# Patient Record
Sex: Female | Born: 1965 | Race: White | Hispanic: No | Marital: Married | Smoking: Current every day smoker
Health system: Southern US, Community
[De-identification: ages and names within clinical notes are randomized; demographics above are authoritative.]

## PROBLEM LIST (undated history)

## (undated) DIAGNOSIS — F329 Major depressive disorder, single episode, unspecified: Secondary | ICD-10-CM

## (undated) DIAGNOSIS — M5136 Other intervertebral disc degeneration, lumbar region: Secondary | ICD-10-CM

## (undated) DIAGNOSIS — M5126 Other intervertebral disc displacement, lumbar region: Secondary | ICD-10-CM

## (undated) DIAGNOSIS — F32A Depression, unspecified: Secondary | ICD-10-CM

## (undated) DIAGNOSIS — T7840XA Allergy, unspecified, initial encounter: Secondary | ICD-10-CM

## (undated) DIAGNOSIS — R7303 Prediabetes: Secondary | ICD-10-CM

## (undated) DIAGNOSIS — F17200 Nicotine dependence, unspecified, uncomplicated: Secondary | ICD-10-CM

## (undated) DIAGNOSIS — M51369 Other intervertebral disc degeneration, lumbar region without mention of lumbar back pain or lower extremity pain: Secondary | ICD-10-CM

## (undated) DIAGNOSIS — I10 Essential (primary) hypertension: Secondary | ICD-10-CM

## (undated) DIAGNOSIS — E785 Hyperlipidemia, unspecified: Secondary | ICD-10-CM

## (undated) HISTORY — DX: Essential (primary) hypertension: I10

## (undated) HISTORY — DX: Hyperlipidemia, unspecified: E78.5

## (undated) HISTORY — DX: Allergy, unspecified, initial encounter: T78.40XA

## (undated) HISTORY — DX: Major depressive disorder, single episode, unspecified: F32.9

## (undated) HISTORY — PX: TUBAL LIGATION: SHX77

## (undated) HISTORY — DX: Other intervertebral disc degeneration, lumbar region: M51.36

## (undated) HISTORY — PX: LAPAROSCOPIC TUBAL LIGATION: SHX1937

## (undated) HISTORY — DX: Nicotine dependence, unspecified, uncomplicated: F17.200

## (undated) HISTORY — DX: Prediabetes: R73.03

## (undated) HISTORY — PX: GANGLION CYST EXCISION: SHX1691

## (undated) HISTORY — DX: Other intervertebral disc displacement, lumbar region: M51.26

## (undated) HISTORY — DX: Depression, unspecified: F32.A

## (undated) HISTORY — DX: Other intervertebral disc degeneration, lumbar region without mention of lumbar back pain or lower extremity pain: M51.369

---

## 2002-06-24 ENCOUNTER — Emergency Department (HOSPITAL_COMMUNITY): Admission: EM | Admit: 2002-06-24 | Discharge: 2002-06-24 | Payer: Self-pay | Admitting: Emergency Medicine

## 2002-06-24 ENCOUNTER — Encounter: Payer: Self-pay | Admitting: Emergency Medicine

## 2005-07-21 ENCOUNTER — Other Ambulatory Visit: Admission: RE | Admit: 2005-07-21 | Discharge: 2005-07-21 | Payer: Self-pay | Admitting: Emergency Medicine

## 2006-03-26 ENCOUNTER — Ambulatory Visit (HOSPITAL_COMMUNITY): Admission: RE | Admit: 2006-03-26 | Discharge: 2006-03-26 | Payer: Self-pay | Admitting: Internal Medicine

## 2008-04-27 ENCOUNTER — Ambulatory Visit (HOSPITAL_COMMUNITY): Admission: RE | Admit: 2008-04-27 | Discharge: 2008-04-27 | Payer: Self-pay | Admitting: Family Medicine

## 2009-05-16 ENCOUNTER — Emergency Department (HOSPITAL_COMMUNITY): Admission: EM | Admit: 2009-05-16 | Discharge: 2009-05-17 | Payer: Self-pay | Admitting: Emergency Medicine

## 2010-04-27 LAB — URINE MICROSCOPIC-ADD ON

## 2010-04-27 LAB — URINALYSIS, ROUTINE W REFLEX MICROSCOPIC
Bilirubin Urine: NEGATIVE
Ketones, ur: NEGATIVE mg/dL
Specific Gravity, Urine: >1.03 — ABNORMAL HIGH (ref 1.005–1.030)
pH: 6 (ref 5.0–8.0)

## 2010-04-27 LAB — URINE CULTURE: Colony Count: 8000

## 2010-04-27 LAB — PREGNANCY, URINE: Preg Test, Ur: NEGATIVE

## 2010-05-05 ENCOUNTER — Emergency Department (HOSPITAL_COMMUNITY)
Admission: EM | Admit: 2010-05-05 | Discharge: 2010-05-05 | Disposition: A | Discharge status: Home / Self Care | Payer: Medicaid Other | Attending: Emergency Medicine | Admitting: Emergency Medicine

## 2010-05-05 DIAGNOSIS — Z79899 Other long term (current) drug therapy: Secondary | ICD-10-CM | POA: Insufficient documentation

## 2010-05-05 DIAGNOSIS — E785 Hyperlipidemia, unspecified: Secondary | ICD-10-CM | POA: Insufficient documentation

## 2010-05-05 DIAGNOSIS — I10 Essential (primary) hypertension: Secondary | ICD-10-CM | POA: Insufficient documentation

## 2010-05-05 DIAGNOSIS — D219 Benign neoplasm of connective and other soft tissue, unspecified: Secondary | ICD-10-CM

## 2010-05-05 DIAGNOSIS — N898 Other specified noninflammatory disorders of vagina: Secondary | ICD-10-CM | POA: Insufficient documentation

## 2010-05-05 LAB — CBC
MCH: 31.7 pg (ref 26.0–34.0)
MCHC: 33.3 g/dL (ref 30.0–36.0)
MCV: 95.2 fL (ref 78.0–100.0)
Platelets: 196 10*3/uL (ref 150–400)

## 2010-05-05 LAB — PREGNANCY, URINE: Preg Test, Ur: NEGATIVE

## 2010-05-05 LAB — DIFFERENTIAL
Basophils Relative: 1 % (ref 0–1)
Eosinophils Absolute: 0.1 10*3/uL (ref 0.0–0.7)
Eosinophils Relative: 2 % (ref 0–5)
Lymphs Abs: 2 10*3/uL (ref 0.7–4.0)
Monocytes Absolute: 0.7 10*3/uL (ref 0.1–1.0)
Monocytes Relative: 9 % (ref 3–12)

## 2010-05-05 LAB — BASIC METABOLIC PANEL
BUN: 17 mg/dL (ref 6–23)
Calcium: 9.4 mg/dL (ref 8.4–10.5)
Chloride: 105 mEq/L (ref 96–112)
Creatinine, Ser: 0.9 mg/dL (ref 0.4–1.2)
GFR calc Af Amer: >60 mL/min (ref >60)

## 2010-05-06 ENCOUNTER — Ambulatory Visit (HOSPITAL_COMMUNITY)
Admit: 2010-05-06 | Discharge: 2010-05-06 | Disposition: A | Discharge status: Home / Self Care | Payer: Medicaid Other | Source: Ambulatory Visit | Attending: Emergency Medicine | Admitting: Emergency Medicine

## 2010-05-06 ENCOUNTER — Other Ambulatory Visit (HOSPITAL_COMMUNITY): Payer: Self-pay | Admitting: Emergency Medicine

## 2010-05-06 DIAGNOSIS — D219 Benign neoplasm of connective and other soft tissue, unspecified: Secondary | ICD-10-CM

## 2010-05-06 DIAGNOSIS — N920 Excessive and frequent menstruation with regular cycle: Secondary | ICD-10-CM | POA: Insufficient documentation

## 2010-05-06 DIAGNOSIS — D259 Leiomyoma of uterus, unspecified: Secondary | ICD-10-CM | POA: Insufficient documentation

## 2010-06-17 ENCOUNTER — Encounter (HOSPITAL_COMMUNITY): Payer: Medicaid Other

## 2010-06-17 ENCOUNTER — Other Ambulatory Visit: Payer: Self-pay | Admitting: Obstetrics & Gynecology

## 2010-06-17 LAB — SURGICAL PCR SCREEN
MRSA, PCR: NEGATIVE
Staphylococcus aureus: NEGATIVE

## 2010-06-17 LAB — CBC
HCT: 42.3 % (ref 36.0–46.0)
MCHC: 32.2 g/dL (ref 30.0–36.0)
MCV: 95.7 fL (ref 78.0–100.0)
Platelets: 172 10*3/uL (ref 150–400)
RDW: 13.4 % (ref 11.5–15.5)
WBC: 6 10*3/uL (ref 4.0–10.5)

## 2010-06-17 LAB — COMPREHENSIVE METABOLIC PANEL
Albumin: 3.9 g/dL (ref 3.5–5.2)
BUN: 13 mg/dL (ref 6–23)
Calcium: 10.4 mg/dL (ref 8.4–10.5)
Glucose, Bld: 104 mg/dL — ABNORMAL HIGH (ref 70–99)
Potassium: 4.4 mEq/L (ref 3.5–5.1)
Sodium: 139 mEq/L (ref 135–145)
Total Protein: 6.8 g/dL (ref 6.0–8.3)

## 2010-06-21 ENCOUNTER — Encounter: Payer: Self-pay | Admitting: Obstetrics & Gynecology

## 2010-06-21 LAB — HCG, QUANTITATIVE, PREGNANCY: hCG, Beta Chain, Quant, S: 1 m[IU]/mL (ref <5)

## 2010-06-21 NOTE — Progress Notes (Signed)
Subjective:     Patient ID: Cassandra Goodman, female   DOB: 1965/05/07, 46 y.o.   MRN: [id number]  Vaginal Bleeding The patient's primary symptoms include pelvic pain and vaginal bleeding. The patient's pertinent negatives include no genital itching, genital lesions, genital odor, genital rash, missed menses or vaginal discharge. This is a chronic problem. The current episode started more than 1 year ago. The problem has been gradually worsening. The pain is moderate. She is not pregnant. Associated symptoms include abdominal pain. Pertinent negatives include no anorexia, back pain, chills, constipation, diarrhea, discolored urine, dysuria, fever, flank pain, frequency, headaches, hematuria, joint pain, joint swelling, nausea, painful intercourse, rash, sore throat, urgency or vomiting.  As patient has gotten older periods much worse.  She went to ER and sonogram essentially normal, small fibroids of no clinical consequence.     Review of Systems  Constitutional: Negative.  Negative for fever and chills.  HENT: Negative.  Negative for sore throat.   Eyes: Negative.   Respiratory: Negative.   Cardiovascular: Negative.   Gastrointestinal: Positive for abdominal pain. Negative for nausea, vomiting, diarrhea, constipation and anorexia.  Genitourinary: Positive for vaginal bleeding, menstrual problem and pelvic pain. Negative for dysuria, urgency, frequency, hematuria, flank pain, decreased urine volume, vaginal discharge, enuresis, difficulty urinating, genital sores, vaginal pain, dyspareunia and missed menses.  Musculoskeletal: Negative.  Negative for back pain and joint pain.  Skin: Negative for rash.  Neurological: Negative.  Negative for headaches.  Hematological: Negative.   Psychiatric/Behavioral: Negative.        Objective:   Physical Exam  Constitutional: She is oriented to person, place, and time. She appears well-developed and well-nourished.  HENT:  Head: Normocephalic and  atraumatic.  Eyes: Pupils are equal, round, and reactive to light.  Neck: Normal range of motion. Neck supple. No thyromegaly present.  Cardiovascular: Normal rate, regular rhythm and normal heart sounds.   Pulmonary/Chest: Effort normal and breath sounds normal.  Abdominal: Soft. Bowel sounds are normal.  Genitourinary: Vagina normal and uterus normal.       Cervix normal, ovaries normal sized nontender  Musculoskeletal: Normal range of motion.  Neurological: She is alert and oriented to person, place, and time. She has normal reflexes.  Skin: Skin is warm and dry.  Psychiatric: She has a normal mood and affect. Her behavior is normal. Judgment and thought content normal.       Assessment:     Menorrhagia, chronic Metrorrhagia recently Dysmenorrhea    Plan:     Patient has responded to [facility] and wants to proceed with endometrial ablation which is scheduled for 06/22/2010.  Pamphlets given and indications/complications discussed.

## 2010-06-22 ENCOUNTER — Other Ambulatory Visit: Payer: Self-pay | Admitting: Obstetrics & Gynecology

## 2010-06-22 ENCOUNTER — Ambulatory Visit (HOSPITAL_COMMUNITY)
Admission: RE | Admit: 2010-06-22 | Discharge: 2010-06-22 | Disposition: A | Discharge status: Home / Self Care | Payer: Medicaid Other | Source: Ambulatory Visit | Attending: Obstetrics & Gynecology | Admitting: Obstetrics & Gynecology

## 2010-06-22 DIAGNOSIS — Z7982 Long term (current) use of aspirin: Secondary | ICD-10-CM | POA: Insufficient documentation

## 2010-06-22 DIAGNOSIS — Z01812 Encounter for preprocedural laboratory examination: Secondary | ICD-10-CM | POA: Insufficient documentation

## 2010-06-22 DIAGNOSIS — N946 Dysmenorrhea, unspecified: Secondary | ICD-10-CM | POA: Insufficient documentation

## 2010-06-22 DIAGNOSIS — Z79899 Other long term (current) drug therapy: Secondary | ICD-10-CM | POA: Insufficient documentation

## 2010-06-22 DIAGNOSIS — N920 Excessive and frequent menstruation with regular cycle: Secondary | ICD-10-CM | POA: Insufficient documentation

## 2010-08-10 NOTE — Op Note (Signed)
  [name], TAKEDA NO.:  [id number]  MEDICAL RECORD NO.:  [id number]  LOCATION:  [facility]                          FACILITY:  [facility]  PHYSICIAN:  Lazaro Arms, M.D.   DATE OF BIRTH:  1966-02-05  DATE OF PROCEDURE:  06/22/2010 DATE OF DISCHARGE:  06/22/2010                              OPERATIVE REPORT   PREOPERATIVE DIAGNOSES: 1. Menometrorrhagia. 2. Dysmenorrhea.  POSTOPERATIVE DIAGNOSES: 1. Menometrorrhagia. 2. Dysmenorrhea.  PROCEDURE:  Hysteroscopy, dilation and curettage, and endometrial ablation.  SURGEON:  Lazaro Arms, MD  ANESTHESIA:  Laryngeal mask airway.  FINDINGS:  The patient had normal endometrium.  No polyps, fibroids, or abnormalities.  DESCRIPTION OF OPERATION:  The patient was taken to the operating room and placed in supine position where she underwent laryngeal mask airway anesthesia, placed in dorsal supine position, prepped and draped in the usual sterile fashion.  Graves speculum was placed.  Cervix was grasped and dilated serially to allow passage of the hysteroscope.  A diagnostic hysteroscopy was performed and with video hysteroscope and there were no abnormalities seen.  A vigorous uterine curettage was performed and there was good uterine cry obtained in all areas.  ThermaChoice III endometrial ablation balloon was used and maintained a pressure between 192 mmHg throughout the procedure, which the patient tolerated well. Total heating therapy time was 8 minutes.  All the fluids returned at the end of the procedure and the equipment worked well.  The patient was awakened from anesthesia and taken recovery room in good and stable condition.  All counts correct x3.  She received Ancef and Toradol prophylactically.    Lazaro Arms, M.D.    Loraine Maple  D:  08/09/2010  T:  08/10/2010  Job:  [id number]  Electronically Signed by Duane Lope M.D. on 08/10/2010 04:45:11 PM

## 2011-01-18 ENCOUNTER — Other Ambulatory Visit (HOSPITAL_COMMUNITY): Payer: Self-pay | Admitting: Family Medicine

## 2011-01-18 DIAGNOSIS — N6320 Unspecified lump in the left breast, unspecified quadrant: Secondary | ICD-10-CM

## 2011-01-19 ENCOUNTER — Other Ambulatory Visit (HOSPITAL_BASED_OUTPATIENT_CLINIC_OR_DEPARTMENT_OTHER): Payer: Self-pay | Admitting: Internal Medicine

## 2011-02-01 ENCOUNTER — Ambulatory Visit (HOSPITAL_COMMUNITY)
Admission: RE | Admit: 2011-02-01 | Discharge: 2011-02-01 | Disposition: A | Discharge status: Home / Self Care | Payer: PRIVATE HEALTH INSURANCE | Source: Ambulatory Visit | Attending: Family Medicine | Admitting: Family Medicine

## 2011-02-01 ENCOUNTER — Other Ambulatory Visit (HOSPITAL_COMMUNITY): Payer: Self-pay | Admitting: Family Medicine

## 2011-02-01 DIAGNOSIS — N6320 Unspecified lump in the left breast, unspecified quadrant: Secondary | ICD-10-CM

## 2011-02-01 DIAGNOSIS — N63 Unspecified lump in unspecified breast: Secondary | ICD-10-CM | POA: Insufficient documentation

## 2012-05-21 ENCOUNTER — Encounter (HOSPITAL_COMMUNITY): Payer: Self-pay | Admitting: *Deleted

## 2012-05-21 ENCOUNTER — Emergency Department (HOSPITAL_COMMUNITY)
Admission: EM | Admit: 2012-05-21 | Discharge: 2012-05-21 | Disposition: A | Discharge status: Home / Self Care | Payer: Self-pay | Attending: Emergency Medicine | Admitting: Emergency Medicine

## 2012-05-21 DIAGNOSIS — Z23 Encounter for immunization: Secondary | ICD-10-CM | POA: Insufficient documentation

## 2012-05-21 DIAGNOSIS — L02419 Cutaneous abscess of limb, unspecified: Secondary | ICD-10-CM | POA: Insufficient documentation

## 2012-05-21 DIAGNOSIS — L0291 Cutaneous abscess, unspecified: Secondary | ICD-10-CM

## 2012-05-21 DIAGNOSIS — I10 Essential (primary) hypertension: Secondary | ICD-10-CM | POA: Insufficient documentation

## 2012-05-21 DIAGNOSIS — Z8639 Personal history of other endocrine, nutritional and metabolic disease: Secondary | ICD-10-CM | POA: Insufficient documentation

## 2012-05-21 DIAGNOSIS — F17200 Nicotine dependence, unspecified, uncomplicated: Secondary | ICD-10-CM | POA: Insufficient documentation

## 2012-05-21 DIAGNOSIS — Z7982 Long term (current) use of aspirin: Secondary | ICD-10-CM | POA: Insufficient documentation

## 2012-05-21 DIAGNOSIS — Z862 Personal history of diseases of the blood and blood-forming organs and certain disorders involving the immune mechanism: Secondary | ICD-10-CM | POA: Insufficient documentation

## 2012-05-21 MED ORDER — DIPHTH-ACELL PERTUSSIS-TETANUS 25-58-10 LF-MCG/0.5 IM SUSP: 0.5000 mL | Freq: Once | INTRAMUSCULAR | Status: DC

## 2012-05-21 MED ORDER — SULFAMETHOXAZOLE-TRIMETHOPRIM 800-160 MG PO TABS: 1.0000 | ORAL_TABLET | Freq: Two times a day (BID) | ORAL | Status: DC

## 2012-05-21 MED ORDER — TRAMADOL HCL 50 MG PO TABS: 50.0000 mg | ORAL_TABLET | Freq: Four times a day (QID) | ORAL | Status: DC | PRN

## 2012-05-21 MED ORDER — TETANUS-DIPHTH-ACELL PERTUSSIS 5-2.5-18.5 LF-MCG/0.5 IM SUSP
0.5000 mL | Freq: Once | INTRAMUSCULAR | Status: AC
  Administered 2012-05-21: 0.5 mL via INTRAMUSCULAR
  Filled 2012-05-21: qty 0.5

## 2012-05-21 NOTE — ED Notes (Signed)
Patient with no complaints at this time. Respirations even and unlabored. Skin warm/dry. Discharge instructions reviewed with patient at this time. Patient given opportunity to voice concerns/ask questions. Patient discharged at this time and left Emergency Department with steady gait.   

## 2012-05-21 NOTE — ED Notes (Signed)
Informed of shot time to discharge.

## 2012-05-21 NOTE — ED Notes (Signed)
Pt c/o abscess that started right leg on 04/23/2012, states that she has two new areas that started a few days ago, area is draining,

## 2012-05-21 NOTE — ED Provider Notes (Signed)
History    This chart was scribed for Cassandra Lennert, MD by Quintella Reichert, ED scribe.  This patient was seen in room [facility] and the patient's care was started at 8:56 AM.   CSN: [id number]  Arrival date & time 05/21/12  0802      Chief Complaint  Patient presents with  . Abscess     Patient is a 47 y.o. female presenting with abscess. The history is provided by the patient. No language interpreter was used.  Abscess Location:  Leg Leg abscess location:  R upper leg Size:  1 cm Abscess quality: draining and painful   Red streaking: no   Duration:  1 month Progression:  Worsening Pain details:    Quality:  Unable to specify   Severity:  Moderate   Duration:  1 month   Timing:  Constant   Progression:  Worsening Chronicity:  New Relieved by:  None tried Worsened by:  Nothing tried Ineffective treatments:  None tried Associated symptoms: no fatigue and no headaches    Cassandra Goodman is a 47 y.o. female who presents to the Emergency Department complaining of 2 abscessed on posterior right thigh that have been present for one month.  Pt states that both abscesses are painful and one is draining. Pt denies fever, chills, nausea, vomiting, diarrhea, weakness, cough, SOB and any other pain.    Past Medical History  Diagnosis Date  . Hypertension   . Hyperlipidemia     Past Surgical History  Procedure Laterality Date  . Laparoscopic tubal ligation      No family history on file.  History  Substance Use Topics  . Smoking status: Current Every Day Smoker -- 0.50 packs/day for 20 years    Types: Cigarettes  . Smokeless tobacco: Never Used     Comment: Pt had quit but restarted  . Alcohol Use: No    OB History   Grav Para Term Preterm Abortions TAB SAB Ect Mult Living   2 2 0 0 0 0 0 0 0 2       Review of Systems  Constitutional: Negative for appetite change and fatigue.  HENT: Negative for congestion, sinus pressure and ear discharge.   Eyes:  Negative for discharge.  Respiratory: Negative for cough.   Cardiovascular: Negative for chest pain.  Gastrointestinal: Negative for abdominal pain and diarrhea.  Genitourinary: Negative for frequency and hematuria.  Musculoskeletal: Negative for back pain.  Skin: Negative for rash.  Neurological: Negative for seizures and headaches.  Psychiatric/Behavioral: Negative for hallucinations.      Allergies  Morphine and related  Home Medications   Current Outpatient Rx  Name  Route  Sig  Dispense  Refill  . aspirin 81 MG tablet   Oral   Take 81 mg by mouth daily.           . multivitamin (THERAGRAN) per tablet   Oral   Take 1 tablet by mouth daily.             BP 145/102  Pulse 90  Temp(Src) 97.2 F (36.2 C) (Oral)  Resp 18  Ht 5\' 2"  (1.575 m)  Wt 209 lb 7 oz (95 kg)  BMI 38.3 kg/m2  SpO2 98%  Physical Exam  Nursing note and vitals reviewed. Constitutional: She is oriented to person, place, and time. She appears well-developed.  HENT:  Head: Normocephalic.  Eyes: Conjunctivae are normal.  Neck: No tracheal deviation present.  Cardiovascular:  No murmur heard. Musculoskeletal: Normal range  of motion.  Neurological: She is oriented to person, place, and time.  Skin: Skin is warm.  Posterior right thigh: 2 1-cm inflamed areas.  One is opened up and drained, the other is small abscess.  Psychiatric: She has a normal mood and affect.      ED Course  Procedures (including critical care time)  DIAGNOSTIC STUDIES: Oxygen Saturation is 98% on room air, normal by my interpretation.    COORDINATION OF CARE: 8:59 AM-Discussed treatment plan which includes pain medication, antibiotics, a tetanus shot, a daily cleaning routine, and f/u with [facility] Department with pt at bedside and pt agreed to plan.  9:19 AM Ordered:  Medications  diptheria-acellular pertussis-tetanus (INFANRIX) injection 0.5 mL (not administered)        Labs Reviewed - No data to  display No results found.   No diagnosis found.    MDM        The chart was scribed for me under my direct supervision.  I personally performed the history, physical, and medical decision making and all procedures in the evaluation of this patient.Cassandra Lennert, MD 05/21/12 [phone]

## 2012-05-23 ENCOUNTER — Encounter (HOSPITAL_COMMUNITY): Payer: Self-pay

## 2012-05-23 ENCOUNTER — Emergency Department (HOSPITAL_COMMUNITY)
Admission: EM | Admit: 2012-05-23 | Discharge: 2012-05-23 | Disposition: A | Discharge status: Home / Self Care | Payer: Self-pay | Attending: Emergency Medicine | Admitting: Emergency Medicine

## 2012-05-23 DIAGNOSIS — I10 Essential (primary) hypertension: Secondary | ICD-10-CM | POA: Insufficient documentation

## 2012-05-23 DIAGNOSIS — F17200 Nicotine dependence, unspecified, uncomplicated: Secondary | ICD-10-CM | POA: Insufficient documentation

## 2012-05-23 DIAGNOSIS — L02419 Cutaneous abscess of limb, unspecified: Secondary | ICD-10-CM | POA: Insufficient documentation

## 2012-05-23 DIAGNOSIS — Z7982 Long term (current) use of aspirin: Secondary | ICD-10-CM | POA: Insufficient documentation

## 2012-05-23 DIAGNOSIS — Z862 Personal history of diseases of the blood and blood-forming organs and certain disorders involving the immune mechanism: Secondary | ICD-10-CM | POA: Insufficient documentation

## 2012-05-23 DIAGNOSIS — L0291 Cutaneous abscess, unspecified: Secondary | ICD-10-CM

## 2012-05-23 DIAGNOSIS — Z79899 Other long term (current) drug therapy: Secondary | ICD-10-CM | POA: Insufficient documentation

## 2012-05-23 DIAGNOSIS — Z8639 Personal history of other endocrine, nutritional and metabolic disease: Secondary | ICD-10-CM | POA: Insufficient documentation

## 2012-05-23 MED ORDER — LIDOCAINE HCL (PF) 2 % IJ SOLN
INTRAMUSCULAR | Status: AC
  Filled 2012-05-23: qty 10

## 2012-05-23 MED ORDER — HYDROCODONE-ACETAMINOPHEN 5-325 MG PO TABS: 1.0000 | ORAL_TABLET | ORAL | Status: DC | PRN

## 2012-05-23 NOTE — ED Notes (Signed)
Here for recheck of two areas to back of right leg that may be ? Staff infection

## 2012-05-23 NOTE — ED Notes (Signed)
Pt here for recheck of abscesses to r posterior thigh.   Area is red and one abscess is open and draining.  Pt says is presently on antibiotics.  Pt says she feels like the abscesses are worse.

## 2012-05-25 LAB — CULTURE, ROUTINE-ABSCESS

## 2012-05-25 NOTE — ED Provider Notes (Signed)
History     CSN: [id number]  Arrival date & time 05/23/12  0746   First MD Initiated Contact with Patient 05/23/12 0809      Chief Complaint  Patient presents with  . Wound Check    (Consider location/radiation/quality/duration/timing/severity/associated sxs/prior treatment) HPI Comments: Cassandra Goodman is a 47 y.o. Female presenting for a recheck of 2 abscess on her right posterior thigh which have been present for approximately the last week now.  She was seen here 2 days ago and placed on bactrim and she has been using warm compress soaks.  The upper abscess which was drainage has now sealed over and has stopped draining,  Reporting this one is starting to feel better.  The lower abscess is larger and now more painful and has not drained.  She denies fevers or chills, nausea or vomiting.  There has been no radiation of pain, but is worse with palpation. Pain is improved with the tramadol she was prescribed, but briefly.     The history is provided by the patient.    Past Medical History  Diagnosis Date  . Hypertension   . Hyperlipidemia     Past Surgical History  Procedure Laterality Date  . Laparoscopic tubal ligation      No family history on file.  History  Substance Use Topics  . Smoking status: Current Every Day Smoker -- 0.50 packs/day for 20 years    Types: Cigarettes  . Smokeless tobacco: Never Used     Comment: Pt had quit but restarted  . Alcohol Use: No    OB History   Grav Para Term Preterm Abortions TAB SAB Ect Mult Living   2 2 0 0 0 0 0 0 0 2       Review of Systems  Constitutional: Negative for fever and chills.  HENT: Negative for facial swelling.   Respiratory: Negative for shortness of breath and wheezing.   Skin: Positive for wound.  Neurological: Negative for numbness.    Allergies  Morphine and related  Home Medications   Current Outpatient Rx  Name  Route  Sig  Dispense  Refill  . aspirin 81 MG tablet   Oral   Take 81 mg by  mouth daily.           . multivitamin (THERAGRAN) per tablet   Oral   Take 1 tablet by mouth daily.           Marland Kitchen sulfamethoxazole-trimethoprim (BACTRIM DS,SEPTRA DS) 800-160 MG per tablet   Oral   Take 1 tablet by mouth 2 (two) times daily. One po bid x 3 days   20 tablet   0   . traMADol (ULTRAM) 50 MG tablet   Oral   Take 1 tablet (50 mg total) by mouth every 6 (six) hours as needed for pain.   15 tablet   0   . HYDROcodone-acetaminophen (NORCO/VICODIN) 5-325 MG per tablet   Oral   Take 1 tablet by mouth every 4 (four) hours as needed for pain.   15 tablet   0     BP 141/82  Pulse 91  Temp(Src) 97.7 F (36.5 C) (Oral)  Resp 22  Ht 5\' 3"  (1.6 m)  Wt 209 lb (94.802 kg)  BMI 37.03 kg/m2  SpO2 98%  Physical Exam  Constitutional: She appears well-developed and well-nourished. No distress.  HENT:  Head: Normocephalic.  Neck: Neck supple.  Cardiovascular: Normal rate.   Pulmonary/Chest: Effort normal. She has no wheezes.  Musculoskeletal:  Normal range of motion. She exhibits no edema.  Skin:  2 abscess noted right posterior thigh,  The more proximal one is flat with no fluctuance or induration with granulation tissue present giving appearance of 1 cm slightly ulcerated lesion, but healing.  No surrounding erythema.  The lower abscess is also 1 cm,  Raised,  Indurated and exquisitely ttp, 2 cm surrounding erythema,  No drainage.  No fluctuance.    ED Course  Procedures (including critical care time)  INCISION AND DRAINAGE Performed by: Burgess Amor Consent: Verbal consent obtained. Risks and benefits: risks, benefits and alternatives were discussed Type: abscess  Body area: right posterior thigh  Anesthesia: local infiltration  Incision was made with a scalpel.  Local anesthetic: lidocaine 2% without epinephrine  Anesthetic total: 2 ml  Complexity: complex Blunt dissection to break up loculations  Drainage: purulent  Drainage amount:  small  Packing material: no packing, abscess cavity is small  Patient tolerance: Patient tolerated the procedure well with no immediate complications.     Labs Reviewed  CULTURE, ROUTINE-ABSCESS   No results found.   1. Abscess    Wound culture obtained.   MDM  Encouraged continued warm soaks,  Completion of bactrim.  Given script for hydrocodone for use in place of tramadol.  Recheck here or by health dept if sx do not resolve or worsen in any way.       Burgess Amor, PA-C 05/25/12 [phone]

## 2012-05-26 ENCOUNTER — Telehealth (HOSPITAL_COMMUNITY): Payer: Self-pay | Admitting: Emergency Medicine

## 2012-05-26 NOTE — ED Notes (Signed)
Patient has +Abscess culture. °

## 2012-05-26 NOTE — ED Notes (Signed)
+  Abscess. Patient treated with Bactrim on 4/15 visit. Sensitive to same. Per protocol MD.

## 2012-05-27 NOTE — ED Provider Notes (Signed)
Medical screening examination/treatment/procedure(s) were performed by non-physician practitioner and as supervising physician I was immediately available for consultation/collaboration.   Elleni Mozingo W. Latifa Noble, MD 05/27/12 1012 

## 2012-06-14 ENCOUNTER — Encounter (HOSPITAL_COMMUNITY): Payer: Self-pay

## 2012-06-14 ENCOUNTER — Emergency Department (HOSPITAL_COMMUNITY)
Admission: EM | Admit: 2012-06-14 | Discharge: 2012-06-14 | Disposition: A | Discharge status: Home / Self Care | Payer: Self-pay | Attending: Emergency Medicine | Admitting: Emergency Medicine

## 2012-06-14 DIAGNOSIS — R062 Wheezing: Secondary | ICD-10-CM | POA: Insufficient documentation

## 2012-06-14 DIAGNOSIS — Z7982 Long term (current) use of aspirin: Secondary | ICD-10-CM | POA: Insufficient documentation

## 2012-06-14 DIAGNOSIS — Z8639 Personal history of other endocrine, nutritional and metabolic disease: Secondary | ICD-10-CM | POA: Insufficient documentation

## 2012-06-14 DIAGNOSIS — R0602 Shortness of breath: Secondary | ICD-10-CM | POA: Insufficient documentation

## 2012-06-14 DIAGNOSIS — F1010 Alcohol abuse, uncomplicated: Secondary | ICD-10-CM | POA: Insufficient documentation

## 2012-06-14 DIAGNOSIS — Z79899 Other long term (current) drug therapy: Secondary | ICD-10-CM | POA: Insufficient documentation

## 2012-06-14 DIAGNOSIS — F17200 Nicotine dependence, unspecified, uncomplicated: Secondary | ICD-10-CM | POA: Insufficient documentation

## 2012-06-14 DIAGNOSIS — I10 Essential (primary) hypertension: Secondary | ICD-10-CM | POA: Insufficient documentation

## 2012-06-14 DIAGNOSIS — Z862 Personal history of diseases of the blood and blood-forming organs and certain disorders involving the immune mechanism: Secondary | ICD-10-CM | POA: Insufficient documentation

## 2012-06-14 LAB — RAPID URINE DRUG SCREEN, HOSP PERFORMED
Opiates: NOT DETECTED
Tetrahydrocannabinol: NOT DETECTED

## 2012-06-14 LAB — POCT I-STAT, CHEM 8
Chloride: 110 mEq/L (ref 96–112)
Creatinine, Ser: 0.6 mg/dL (ref 0.50–1.10)
Glucose, Bld: 139 mg/dL — ABNORMAL HIGH (ref 70–99)
HCT: 48 % — ABNORMAL HIGH (ref 36.0–46.0)
Potassium: 2.7 mEq/L — CL (ref 3.5–5.1)

## 2012-06-14 LAB — ETHANOL: Alcohol, Ethyl (B): 121 mg/dL — ABNORMAL HIGH (ref 0–11)

## 2012-06-14 MED ORDER — POTASSIUM CHLORIDE CRYS ER 20 MEQ PO TBCR
60.0000 meq | EXTENDED_RELEASE_TABLET | Freq: Once | ORAL | Status: AC
  Administered 2012-06-14: 60 meq via ORAL
  Filled 2012-06-14: qty 3

## 2012-06-14 MED ORDER — ALBUTEROL SULFATE (5 MG/ML) 0.5% IN NEBU
2.5000 mg | INHALATION_SOLUTION | Freq: Once | RESPIRATORY_TRACT | Status: AC
  Administered 2012-06-14: 2.5 mg via RESPIRATORY_TRACT
  Filled 2012-06-14: qty 0.5

## 2012-06-14 MED ORDER — IPRATROPIUM BROMIDE 0.02 % IN SOLN
0.5000 mg | Freq: Once | RESPIRATORY_TRACT | Status: AC
  Administered 2012-06-14: 0.5 mg via RESPIRATORY_TRACT
  Filled 2012-06-14: qty 2.5

## 2012-06-14 MED ORDER — OXYMETAZOLINE HCL 0.05 % NA SOLN
1.0000 | Freq: Once | NASAL | Status: AC
  Administered 2012-06-14: 1 via NASAL
  Filled 2012-06-14: qty 15

## 2012-06-14 MED ORDER — ALPRAZOLAM 0.5 MG PO TABS
0.5000 mg | ORAL_TABLET | Freq: Once | ORAL | Status: AC
  Administered 2012-06-14: 0.5 mg via ORAL
  Filled 2012-06-14: qty 1

## 2012-06-14 NOTE — ED Notes (Signed)
Patient having a panic attack; states that she had a couple of shots, quit her job today per EMS.

## 2012-06-14 NOTE — ED Provider Notes (Signed)
History     CSN: [id number]  Arrival date & time 06/14/12  0030   First MD Initiated Contact with Patient 06/14/12 0044      Chief Complaint  Patient presents with  . Panic Attack    (Consider location/radiation/quality/duration/timing/severity/associated sxs/prior treatment) HPI HPI Comments: HPI Comments: Cassandra Goodman is a 47 y.o. female brought in by ambulance, who presents to the Emergency Department complaining of shortness of breath and having been drinking. She reports she had several shots of liquor, quit her job today and now she feel shortness of breath. She hears some wheezing. She denies nausea, vomiting, fever, chills. She states there is chest pain with deep breathing.   Past Medical History  Diagnosis Date  . Hypertension   . Hyperlipidemia     Past Surgical History  Procedure Laterality Date  . Laparoscopic tubal ligation      No family history on file.  History  Substance Use Topics  . Smoking status: Current Every Day Smoker -- 0.50 packs/day for 20 years    Types: Cigarettes  . Smokeless tobacco: Never Used     Comment: Pt had quit but restarted  . Alcohol Use: No    OB History   Grav Para Term Preterm Abortions TAB SAB Ect Mult Living   2 2 0 0 0 0 0 0 0 2       Review of Systems  Constitutional: Negative for fever.       10 Systems reviewed and are negative for acute change except as noted in the HPI.  HENT: Negative for congestion.   Eyes: Negative for discharge and redness.  Respiratory: Positive for shortness of breath. Negative for cough.   Cardiovascular: Negative for chest pain.  Gastrointestinal: Negative for vomiting and abdominal pain.  Musculoskeletal: Negative for back pain.  Skin: Negative for rash.  Neurological: Negative for syncope, numbness and headaches.  Psychiatric/Behavioral:       No behavior change.    Allergies  Morphine and related  Home Medications   Current Outpatient Rx  Name  Route  Sig  Dispense   Refill  . aspirin 81 MG tablet   Oral   Take 81 mg by mouth daily.           Marland Kitchen HYDROcodone-acetaminophen (NORCO/VICODIN) 5-325 MG per tablet   Oral   Take 1 tablet by mouth every 4 (four) hours as needed for pain.   15 tablet   0   . multivitamin (THERAGRAN) per tablet   Oral   Take 1 tablet by mouth daily.           Marland Kitchen sulfamethoxazole-trimethoprim (BACTRIM DS,SEPTRA DS) 800-160 MG per tablet   Oral   Take 1 tablet by mouth 2 (two) times daily. One po bid x 3 days   20 tablet   0   . traMADol (ULTRAM) 50 MG tablet   Oral   Take 1 tablet (50 mg total) by mouth every 6 (six) hours as needed for pain.   15 tablet   0     BP 147/88  Pulse 94  Temp(Src) 97.7 F (36.5 C) (Oral)  Resp 20  Ht 5\' 4"  (1.626 m)  Wt 205 lb (92.987 kg)  BMI 35.17 kg/m2  SpO2 96%  Physical Exam  Nursing note and vitals reviewed. Constitutional: She appears well-developed and well-nourished.  Awake, alert, nontoxic appearance.  HENT:  Head: Normocephalic and atraumatic.  Eyes: EOM are normal. Pupils are equal, round, and reactive to light.  Neck: Neck supple.  Cardiovascular: Normal rate and intact distal pulses.   Pulmonary/Chest: Effort normal and breath sounds normal. She exhibits no tenderness.  Abdominal: Soft. Bowel sounds are normal. There is no tenderness. There is no rebound.  Musculoskeletal: She exhibits no tenderness.  Baseline ROM, no obvious new focal weakness.  Neurological:  Mental status and motor strength appears baseline for patient and situation.  Skin: No rash noted.  Psychiatric: She has a normal mood and affect.    ED Course  Procedures (including critical care time) Results for orders placed during the hospital encounter of 06/14/12  ETHANOL      Result Value Range   Alcohol, Ethyl (B) 121 (*) 0 - 11 mg/dL  URINE RAPID DRUG SCREEN (HOSP PERFORMED)      Result Value Range   Opiates NONE DETECTED  NONE DETECTED   Cocaine NONE DETECTED  NONE DETECTED    Benzodiazepines NONE DETECTED  NONE DETECTED   Amphetamines NONE DETECTED  NONE DETECTED   Tetrahydrocannabinol NONE DETECTED  NONE DETECTED   Barbiturates NONE DETECTED  NONE DETECTED  POCT I-STAT, CHEM 8      Result Value Range   Sodium 144  135 - 145 mEq/L   Potassium 2.7 (*) 3.5 - 5.1 mEq/L   Chloride 110  96 - 112 mEq/L   BUN 4 (*) 6 - 23 mg/dL   Creatinine, Ser 1.61  0.50 - 1.10 mg/dL   Glucose, Bld 096 (*) 70 - 99 mg/dL   Calcium, Ion 0.45  4.09 - 1.23 mmol/L   TCO2 21  0 - 100 mmol/L   Hemoglobin 16.3 (*) 12.0 - 15.0 g/dL   HCT 81.1 (*) 91.4 - 78.2 %   Comment NOTIFIED PHYSICIAN       Medications  albuterol (PROVENTIL) (5 MG/ML) 0.5% nebulizer solution 2.5 mg (2.5 mg Nebulization Given 06/14/12 0059)  ipratropium (ATROVENT) nebulizer solution 0.5 mg (0.5 mg Nebulization Given 06/14/12 0100)  ALPRAZolam (XANAX) tablet 0.5 mg (0.5 mg Oral Given 06/14/12 0109)  potassium chloride SA (K-DUR,KLOR-CON) CR tablet 60 mEq (60 mEq Oral Given 06/14/12 0127)  oxymetazoline (AFRIN) 0.05 % nasal spray 1 spray (1 spray Each Nare Given 06/14/12 0232)    0234 Patient was given albuterol/atrovent with relief of wheezing. Still felt short of breath with a pulse ox of 97% on RA. Given afrin which cleared the nasal passages. Ambulated in the department with pulse ox 97-98% on RA.  MDM  Patient here with shortness of breath. Given albuterol/atrovent with relief of wheezing but with continued shortness of breath despite pulse ox of 97% on RA. Given Afrin which cleared nasal passages. Potassium was low, 2.7. She was given potassium. Alcohol was 121. UDS negative. Reviewed results with the patient. Pt stable in ED with no significant deterioration in condition.The patient appears reasonably screened and/or stabilized for discharge and I doubt any other medical condition or other [facility] requiring further screening, evaluation, or treatment in the ED at this time prior to discharge.  MDM Reviewed: nursing note and  vitals Interpretation: labs           Nicoletta Dress. Colon Branch, MD 06/14/12 [phone]

## 2012-06-17 LAB — POCT I-STAT TROPONIN I

## 2012-09-10 ENCOUNTER — Encounter (HOSPITAL_COMMUNITY): Payer: Self-pay

## 2012-09-10 ENCOUNTER — Emergency Department (HOSPITAL_COMMUNITY): Payer: Self-pay

## 2012-09-10 ENCOUNTER — Emergency Department (HOSPITAL_COMMUNITY)
Admission: EM | Admit: 2012-09-10 | Discharge: 2012-09-10 | Disposition: A | Discharge status: Home / Self Care | Payer: Self-pay | Attending: Emergency Medicine | Admitting: Emergency Medicine

## 2012-09-10 DIAGNOSIS — Z862 Personal history of diseases of the blood and blood-forming organs and certain disorders involving the immune mechanism: Secondary | ICD-10-CM | POA: Insufficient documentation

## 2012-09-10 DIAGNOSIS — J320 Chronic maxillary sinusitis: Secondary | ICD-10-CM | POA: Insufficient documentation

## 2012-09-10 DIAGNOSIS — R51 Headache: Secondary | ICD-10-CM | POA: Insufficient documentation

## 2012-09-10 DIAGNOSIS — R209 Unspecified disturbances of skin sensation: Secondary | ICD-10-CM | POA: Insufficient documentation

## 2012-09-10 DIAGNOSIS — R202 Paresthesia of skin: Secondary | ICD-10-CM

## 2012-09-10 DIAGNOSIS — I10 Essential (primary) hypertension: Secondary | ICD-10-CM | POA: Insufficient documentation

## 2012-09-10 DIAGNOSIS — Z8639 Personal history of other endocrine, nutritional and metabolic disease: Secondary | ICD-10-CM | POA: Insufficient documentation

## 2012-09-10 DIAGNOSIS — F17200 Nicotine dependence, unspecified, uncomplicated: Secondary | ICD-10-CM | POA: Insufficient documentation

## 2012-09-10 LAB — CBC WITH DIFFERENTIAL/PLATELET
Basophils Absolute: 0 10*3/uL (ref 0.0–0.1)
Eosinophils Absolute: 0.2 10*3/uL (ref 0.0–0.7)
Eosinophils Relative: 2 % (ref 0–5)
Lymphocytes Relative: 33 % (ref 12–46)
MCV: 95.5 fL (ref 78.0–100.0)
Neutrophils Relative %: 57 % (ref 43–77)
Platelets: 189 10*3/uL (ref 150–400)
RDW: 12.9 % (ref 11.5–15.5)
WBC: 6.2 10*3/uL (ref 4.0–10.5)

## 2012-09-10 LAB — ETHANOL: Alcohol, Ethyl (B): <11 mg/dL (ref 0–11)

## 2012-09-10 LAB — RAPID URINE DRUG SCREEN, HOSP PERFORMED
Cocaine: NOT DETECTED
Opiates: NOT DETECTED
Tetrahydrocannabinol: NOT DETECTED

## 2012-09-10 LAB — COMPREHENSIVE METABOLIC PANEL
AST: 38 U/L — ABNORMAL HIGH (ref 0–37)
BUN: 8 mg/dL (ref 6–23)
CO2: 23 mEq/L (ref 19–32)
Calcium: 9.7 mg/dL (ref 8.4–10.5)
Creatinine, Ser: 0.64 mg/dL (ref 0.50–1.10)
GFR calc Af Amer: >90 mL/min (ref >90)
GFR calc non Af Amer: >90 mL/min (ref >90)

## 2012-09-10 LAB — TROPONIN I: Troponin I: <0.3 ng/mL (ref <0.30)

## 2012-09-10 LAB — POCT I-STAT TROPONIN I: Troponin i, poc: 0 ng/mL (ref 0.00–0.08)

## 2012-09-10 LAB — URINALYSIS, ROUTINE W REFLEX MICROSCOPIC
Bilirubin Urine: NEGATIVE
Glucose, UA: NEGATIVE mg/dL
Specific Gravity, Urine: 1.01 (ref 1.005–1.030)
pH: 7 (ref 5.0–8.0)

## 2012-09-10 LAB — APTT: aPTT: 28 seconds (ref 24–37)

## 2012-09-10 LAB — GLUCOSE, CAPILLARY: Glucose-Capillary: 97 mg/dL (ref 70–99)

## 2012-09-10 MED ORDER — FLUTICASONE PROPIONATE 50 MCG/ACT NA SUSP: 2.0000 | Freq: Every day | NASAL | Status: DC

## 2012-09-10 MED ORDER — ONDANSETRON HCL 4 MG/2ML IJ SOLN: 4.0000 mg | Freq: Once | INTRAMUSCULAR | Status: AC

## 2012-09-10 MED ORDER — ASPIRIN 325 MG PO TABS
325.0000 mg | ORAL_TABLET | Freq: Once | ORAL | Status: AC
  Administered 2012-09-10: 325 mg via ORAL
  Filled 2012-09-10: qty 1

## 2012-09-10 MED ORDER — ONDANSETRON HCL 4 MG/2ML IJ SOLN
INTRAMUSCULAR | Status: AC
  Administered 2012-09-10: 4 mg via INTRAVENOUS
  Filled 2012-09-10: qty 2

## 2012-09-10 NOTE — ED Notes (Signed)
Pt reports that she woke from sleep at 12:30 today, having numbness all over her left side.  Feel hot all over her "insides", no difficulty w/ speech or movement.  Headache since waking.

## 2012-09-10 NOTE — ED Provider Notes (Signed)
CSN: [id number]     Arrival date & time 09/10/12  1455 History     First MD Initiated Contact with Patient 09/10/12 1514     Chief Complaint  Patient presents with  . Numbness   (Consider location/radiation/quality/duration/timing/severity/associated sxs/prior Treatment) HPI Comments: Cassandra Goodman is a 47 y.o. Female awoke for her nighttime sleep at 12:30 today and noticed that her left neck left arm and left leg were numb. The discomfort persisted. She was able to eat a peanut butter sandwich, and ambulate to a vehicle. She presents for evaluation by private vehicle. She has had a headache since awakening. She denies fever, chills, nausea, vomiting, weakness, or dizziness. She has had intermittent numbness in her left hand, that she related to repetitive activities at work for 2 weeks. She's not had this checked. She has untreated hypertension. She has not seen a doctor in 2 years. She states she also has hyperlipidemia. She denies chest pain, shortness of breath, back pain or abdominal pain. There are no other known modifying factors.  The history is provided by the patient.    Past Medical History  Diagnosis Date  . Hypertension   . Hyperlipidemia    Past Surgical History  Procedure Laterality Date  . Laparoscopic tubal ligation     No family history on file. History  Substance Use Topics  . Smoking status: Current Every Day Smoker -- 0.50 packs/day for 20 years    Types: Cigarettes  . Smokeless tobacco: Never Used     Comment: Pt had quit but restarted  . Alcohol Use: Yes     Comment: occ   OB History   Grav Para Term Preterm Abortions TAB SAB Ect Mult Living   2 2 0 0 0 0 0 0 0 2      Review of Systems  All other systems reviewed and are negative.    Allergies  Morphine and related  Home Medications   Current Outpatient Rx  Name  Route  Sig  Dispense  Refill  . fluticasone (FLONASE) 50 MCG/ACT nasal spray   Nasal   Place 2 sprays into the nose daily.   16  g   0    BP 137/87  Pulse 64  Temp(Src) 97.8 F (36.6 C) (Oral)  Resp 20  SpO2 100% Physical Exam  Nursing note and vitals reviewed. Constitutional: She is oriented to person, place, and time. She appears well-developed.  Appears older than stated age.   HENT:  Head: Normocephalic and atraumatic.  Eyes: Conjunctivae and EOM are normal. Pupils are equal, round, and reactive to light.  Neck: Normal range of motion and phonation normal. Neck supple.  Cardiovascular: Normal rate, regular rhythm and intact distal pulses.   Pulmonary/Chest: Effort normal and breath sounds normal. She exhibits no tenderness.  Abdominal: Soft. She exhibits no distension. There is no tenderness. There is no guarding.  Musculoskeletal: Normal range of motion.  Neurological: She is alert and oriented to person, place, and time. She has normal strength. She exhibits normal muscle tone.  Very mild decreased light touch sensation to the left lateral neck left upper and lower arms, but not the left hand. Mild decreased light touch sensation left thigh, anterior and posterior. No altered light touch sensation to the left lower leg. Normal finger to nose, and normal heel-to-shin, bilaterally.  Skin: Skin is warm and dry.  Psychiatric: Her behavior is normal. Judgment and thought content normal.   Flat affect, appears depressed    ED Course  Procedures (including critical care time)  Medications  ondansetron (ZOFRAN) injection 4 mg (4 mg Intravenous Given 09/10/12 1928)  aspirin tablet 325 mg (325 mg Oral Given 09/10/12 1952)    Patient Vitals for the past 24 hrs:  BP Temp Temp src Pulse Resp SpO2  09/10/12 1930 137/87 mmHg - - 64 20 -  09/10/12 1915 140/84 mmHg - - 64 12 -  09/10/12 1900 140/87 mmHg - - 65 16 -  09/10/12 1845 139/89 mmHg - - 59 12 -  09/10/12 1844 - - - 60 17 -  09/10/12 1842 139/95 mmHg - - - - -  09/10/12 1700 153/91 mmHg - - 58 15 -  09/10/12 1621 150/80 mmHg - - - 20 100 %  09/10/12  1615 150/80 mmHg - - 62 15 -  09/10/12 1600 138/81 mmHg - - 65 15 -  09/10/12 1545 141/78 mmHg - - 69 16 -  09/10/12 1530 140/74 mmHg - - 73 21 -  09/10/12 1520 - 97.8 F (36.6 C) - - - -  09/10/12 1515 135/83 mmHg - - 72 16 -  09/10/12 1500 132/99 mmHg 97.8 F (36.6 C) Oral 74 20 100 %   5:50 PM Reevaluation with update and discussion. After initial assessment and treatment, an updated evaluation reveals physical examination is unchanged with the exception, of, now, she has additionally some decreased light. She sensation to all 5 fingertips of the left hand. She denies headache, weakness, nausea, or vomiting. Vital signs were repeated and are reassuring. Cassandra Goodman    5:42 PM-Consult complete with Dr. Marjory Sneddon. Patient case explained and discussed. He feels pt needs an MR to evaluate for MS vs. CVA.  Call ended at 17:55  7:41 PM-Consult complete with Dr. Clista Bernhardt. Patient case explained and discussed. He agrees to see patient in office for further evaluation and treatment. Call ended at 1945  7:47 PM Reevaluation with update and discussion. After initial assessment and treatment, an updated evaluation reveals she remains comfortable. She relates sinus congestion for sevearl weeks. Cassandra Goodman    Date: 09/10/12  Rate: 75  Rhythm: normal sinus rhythm  QRS Axis: normal  PR and QT Intervals: normal  ST/T Wave abnormalities: normal  PR and QRS Conduction Disutrbances:none  Narrative Interpretation:   Old EKG Reviewed: unchanged- 06/17/10   Labs Reviewed  COMPREHENSIVE METABOLIC PANEL - Abnormal; Notable for the following:    Glucose, Bld 117 (*)    AST 38 (*)    ALT 40 (*)    All other components within normal limits  URINALYSIS, ROUTINE W REFLEX MICROSCOPIC - Abnormal; Notable for the following:    Hgb urine dipstick SMALL (*)    All other components within normal limits  CBC WITH DIFFERENTIAL - Abnormal; Notable for the following:    Hemoglobin 15.3 (*)    All other  components within normal limits  ETHANOL  PROTIME-INR  APTT  TROPONIN I  URINE RAPID DRUG SCREEN (HOSP PERFORMED)  GLUCOSE, CAPILLARY  URINE MICROSCOPIC-ADD ON  POCT I-STAT TROPONIN I   Ct Head Wo Contrast  09/10/2012   *RADIOLOGY REPORT*  Clinical Data: Left-sided hand numbness.  Tingling in left-sided neck and arm.  CT HEAD WITHOUT CONTRAST  Technique:  Contiguous axial images were obtained from the base of the skull through the vertex without contrast.  Comparison: None.  Findings: Bone windows demonstrate near complete opacification of left maxillary sinus.  Soft tissue windows demonstrate probable dilated perivascular space in the left basal ganglia image  13/series 2. No  mass lesion, hemorrhage, hydrocephalus, acute infarct, intra-axial, or extra- axial fluid collection.  IMPRESSION:  1. No acute intracranial abnormality. 2.  Sinus disease.   Original Report Authenticated By: Jeronimo Greaves, M.D.   Mr Brain Wo Contrast  09/10/2012   *RADIOLOGY REPORT*  Clinical Data: 47 year old female with headache weakness and confusion.  MRI HEAD WITHOUT CONTRAST  Technique:  Multiplanar, multiecho pulse sequences of the brain and surrounding structures were obtained according to standard protocol without intravenous contrast.  Comparison: Head CT without contrast 09/10/2012.  Findings: Study is occasionally degraded by motion artifact despite repeated imaging attempts.  No restricted diffusion to suggest acute infarction.  No midline shift, mass effect, evidence of mass lesion, ventriculomegaly, extra-axial collection or acute intracranial hemorrhage. Cervicomedullary junction and pituitary are within normal limits. Negative visualized superior cervical spine. Major intracranial vascular flow voids are preserved.  Wallace Cullens and white matter signal is within normal limits for age throughout the brain.  Normal bone marrow signal. Visualized orbit soft tissues are within normal limits.  Mastoids are clear.  Grossly normal  visualized internal auditory structures.  Left maxillary sinus fluid level. Mild right maxillary sinus mucosal thickening or mucous retention cyst.  Other paranasal sinuses are clear.  Negative scalp soft tissues.  IMPRESSION: 1. Normal noncontrast MRI appearance of the brain. 2.  Left maxillary sinus inflammatory changes.   Original Report Authenticated By: Erskine Speed, M.D.   1. Paresthesia   2. Sinusitis, maxillary, chronic     MDM  Nonspecific paresthesia, and relatively young patient. She is at risk for multiple sclerosis. No lesions were seen on the MR. Both CT and MR dated indicates sinusitis. Sinusitis is incidental. Doubt metabolic instability, serious bacterial infection or impending vascular collapse; the patient is stable for discharge.  Nursing Notes Reviewed/ Care Coordinated, and agree without changes. Applicable Imaging Reviewed.  Interpretation of Laboratory Data incorporated into ED treatment   Plan: Home Medications- Flonase; Home Treatments and Observation- rest, watch for progressive symptom; return here if the recommended treatment, does not improve the symptoms; Recommended follow up- neurology followup 2-3 days        Flint Melter, MD 09/11/12 [phone]

## 2012-09-10 NOTE — ED Notes (Signed)
Dr.wentz notified. Of pt's arrival

## 2012-09-10 NOTE — ED Notes (Signed)
Patient to MRI.

## 2012-09-10 NOTE — ED Notes (Signed)
Pt over for ct

## 2012-09-11 NOTE — Progress Notes (Signed)
   CARE MANAGEMENT ED NOTE 09/11/2012  Patient:  KADISHA, GOODINE   Account Number:  [id number]  Date Initiated:  09/10/2012  Documentation initiated by:  Anibal Henderson  Subjective/Objective Assessment:   ED CM noted pt did not have insurance nor PCP listed     Subjective/Objective Assessment Detail:   Sppke with pt and she does not have ins, nor PCP. Gave handouts for [facility], food [employer]. Patient very appreciative     Action/Plan:   Action/Plan Detail:   Anticipated DC Date:  09/10/2012     Status Recommendation to Physician:   Result of Recommendation:        Choice offered to / List presented to:            Status of service:  Completed, signed off  ED Comments:   ED Comments Detail:     CARE MANAGEMENT ED NOTE 09/11/2012  Patient:  MARCINA, KINNISON   Account Number:  [id number]  Date Initiated:  09/10/2012  Documentation initiated by:  Anibal Henderson  Subjective/Objective Assessment:   ED CM noted pt did not have insurance nor PCP listed     Subjective/Objective Assessment Detail:   Sppke with pt and she does not have ins, nor PCP. Gave handouts for [facility], food [employer]. Patient very appreciative     Action/Plan:   Action/Plan Detail:   Anticipated DC Date:  09/10/2012     Status Recommendation to Physician:   Result of Recommendation:        Choice offered to / List presented to:            Status of service:  Completed, signed off  ED Comments:   ED Comments Detail:

## 2012-10-09 ENCOUNTER — Other Ambulatory Visit (HOSPITAL_COMMUNITY): Payer: Self-pay | Admitting: Physician Assistant

## 2012-10-09 DIAGNOSIS — Z139 Encounter for screening, unspecified: Secondary | ICD-10-CM

## 2012-10-14 ENCOUNTER — Ambulatory Visit (HOSPITAL_COMMUNITY): Payer: Self-pay

## 2012-10-21 ENCOUNTER — Ambulatory Visit (HOSPITAL_COMMUNITY)
Admission: RE | Admit: 2012-10-21 | Discharge: 2012-10-21 | Disposition: A | Discharge status: Home / Self Care | Payer: Self-pay | Source: Ambulatory Visit | Attending: Physician Assistant | Admitting: Physician Assistant

## 2012-10-21 DIAGNOSIS — Z139 Encounter for screening, unspecified: Secondary | ICD-10-CM

## 2013-11-04 ENCOUNTER — Encounter (HOSPITAL_COMMUNITY): Payer: Self-pay | Admitting: Emergency Medicine

## 2013-11-04 ENCOUNTER — Emergency Department (HOSPITAL_COMMUNITY)
Admission: EM | Admit: 2013-11-04 | Discharge: 2013-11-04 | Disposition: A | Discharge status: Home / Self Care | Payer: Self-pay | Attending: Emergency Medicine | Admitting: Emergency Medicine

## 2013-11-04 DIAGNOSIS — Z8639 Personal history of other endocrine, nutritional and metabolic disease: Secondary | ICD-10-CM | POA: Insufficient documentation

## 2013-11-04 DIAGNOSIS — L02519 Cutaneous abscess of unspecified hand: Secondary | ICD-10-CM | POA: Insufficient documentation

## 2013-11-04 DIAGNOSIS — W268XXA Contact with other sharp object(s), not elsewhere classified, initial encounter: Secondary | ICD-10-CM | POA: Insufficient documentation

## 2013-11-04 DIAGNOSIS — L089 Local infection of the skin and subcutaneous tissue, unspecified: Secondary | ICD-10-CM | POA: Insufficient documentation

## 2013-11-04 DIAGNOSIS — Z862 Personal history of diseases of the blood and blood-forming organs and certain disorders involving the immune mechanism: Secondary | ICD-10-CM | POA: Insufficient documentation

## 2013-11-04 DIAGNOSIS — Y9289 Other specified places as the place of occurrence of the external cause: Secondary | ICD-10-CM | POA: Insufficient documentation

## 2013-11-04 DIAGNOSIS — L03119 Cellulitis of unspecified part of limb: Secondary | ICD-10-CM

## 2013-11-04 DIAGNOSIS — Y9389 Activity, other specified: Secondary | ICD-10-CM | POA: Insufficient documentation

## 2013-11-04 DIAGNOSIS — L02511 Cutaneous abscess of right hand: Secondary | ICD-10-CM

## 2013-11-04 DIAGNOSIS — I10 Essential (primary) hypertension: Secondary | ICD-10-CM | POA: Insufficient documentation

## 2013-11-04 DIAGNOSIS — IMO0002 Reserved for concepts with insufficient information to code with codable children: Secondary | ICD-10-CM | POA: Insufficient documentation

## 2013-11-04 DIAGNOSIS — S61409A Unspecified open wound of unspecified hand, initial encounter: Secondary | ICD-10-CM | POA: Insufficient documentation

## 2013-11-04 DIAGNOSIS — F17200 Nicotine dependence, unspecified, uncomplicated: Secondary | ICD-10-CM | POA: Insufficient documentation

## 2013-11-04 MED ORDER — DOXYCYCLINE HYCLATE 100 MG PO TABS
100.0000 mg | ORAL_TABLET | Freq: Once | ORAL | Status: AC
  Administered 2013-11-04: 100 mg via ORAL
  Filled 2013-11-04: qty 1

## 2013-11-04 MED ORDER — DOXYCYCLINE HYCLATE 100 MG IV SOLR
100.0000 mg | Freq: Once | INTRAVENOUS | Status: AC
Start: 2013-11-04 — End: 2013-11-04
  Administered 2013-11-04: 100 mg via INTRAVENOUS
  Filled 2013-11-04: qty 100

## 2013-11-04 MED ORDER — LIDOCAINE HCL (PF) 1 % IJ SOLN
5.0000 mL | Freq: Once | INTRAMUSCULAR | Status: AC
  Administered 2013-11-04: 5 mL
  Filled 2013-11-04: qty 5

## 2013-11-04 MED ORDER — DOXYCYCLINE HYCLATE 100 MG PO CAPS: 100.0000 mg | ORAL_CAPSULE | Freq: Two times a day (BID) | ORAL | Status: DC

## 2013-11-04 MED ORDER — FENTANYL CITRATE 0.05 MG/ML IJ SOLN
100.0000 ug | Freq: Once | INTRAMUSCULAR | Status: AC
  Administered 2013-11-04: 100 ug via INTRAVENOUS
  Filled 2013-11-04: qty 2

## 2013-11-04 MED ORDER — HYDROMORPHONE HCL 1 MG/ML IJ SOLN
1.0000 mg | Freq: Once | INTRAMUSCULAR | Status: AC
  Administered 2013-11-04: 1 mg via INTRAVENOUS
  Filled 2013-11-04: qty 1

## 2013-11-04 MED ORDER — OXYCODONE-ACETAMINOPHEN 5-325 MG PO TABS
2.0000 | ORAL_TABLET | Freq: Once | ORAL | Status: AC
  Administered 2013-11-04: 2 via ORAL
  Filled 2013-11-04: qty 2

## 2013-11-04 NOTE — Discharge Instructions (Signed)

## 2013-11-04 NOTE — ED Notes (Signed)
PT cut her right hand on a cardboard box Thursday and it has progressively worsened with redness/swelling/pain since then.

## 2013-11-04 NOTE — ED Provider Notes (Signed)
CSN: [id number]     Arrival date & time 11/04/13  0715 History  This chart was scribed for Cassandra Cable, MD by Delphia Grates, ED Scribe. This patient was seen in room APA06/APA06 and the patient's care was started at 7:43 AM.    Chief Complaint  Patient presents with  . Wound Infection    The history is provided by the patient. No language interpreter was used.   HPI Comments: Cassandra Goodman is a 48 y.o. female who presents to the Emergency Department complaining of a gradually worsening wound infection to right hand onset 5 days ago. Patient accidentally cut her right hand on a cardboard box, and states the wound has gotten progressively worse since. There is associated redness, swelling, and pain to the area. She denies any foreign bodies, drainage, or loss of sensation. Patient is unsure of when she received her last tetanus immunization.  Past Medical History  Diagnosis Date  . Hypertension   . Hyperlipidemia    Past Surgical History  Procedure Laterality Date  . Laparoscopic tubal ligation     No family history on file. History  Substance Use Topics  . Smoking status: Current Every Day Smoker -- 0.50 packs/day for 20 years    Types: Cigarettes  . Smokeless tobacco: Never Used     Comment: Pt had quit but restarted  . Alcohol Use: Yes     Comment: occ   OB History   Grav Para Term Preterm Abortions TAB SAB Ect Mult Living   2 2 0 0 0 0 0 0 0 2      Review of Systems  Constitutional: Negative for fever.  Gastrointestinal: Negative for vomiting.  Skin: Positive for wound.  Neurological: Negative for weakness and numbness.  All other systems reviewed and are negative.     Allergies  Morphine and related  Home Medications   Prior to Admission medications   Medication Sig Start Date End Date Taking? Authorizing Provider  fluticasone (FLONASE) 50 MCG/ACT nasal spray Place 2 sprays into the nose daily. 09/10/12   Richarda Blade, MD   Triage Vitals: BP  133/80  Pulse 88  Temp(Src) 98.3 F (36.8 C) (Oral)  Resp 18  Ht 5\' 4"  (1.626 m)  Wt 193 lb (87.544 kg)  BMI 33.11 kg/m2  SpO2 99%  Physical Exam CONSTITUTIONAL: Well developed/well nourished HEAD: Normocephalic/atraumatic ENMT: Mucous membranes moist NECK: supple no meningeal signs CV: S1/S2 noted, no murmurs/rubs/gallops noted LUNGS: Lungs are clear to auscultation bilaterally, no apparent distress ABDOMEN: soft, nontender, no rebound or guarding GU:no cva tenderness NEURO: Pt is awake/alert, moves all extremitiesx4 EXTREMITIES: pulses normal, full ROM. Patient has full flexion of thumb and fingers on right hand. No crepitus, no streaking noted to arm SKIN: warm, color normal,abscess noted to thenar eminence with overlying erythema.  No crepitus noted PSYCH: no abnormalities of mood noted  ED Course  Procedures   DIAGNOSTIC STUDIES: Oxygen Saturation is 99% on room air, normal by my interpretation.    COORDINATION OF CARE: At [phone] Discussed treatment plan with patient which includes wound cleaning and I&D. Patient agrees.   INCISION AND DRAINAGE PROCEDURE NOTE: Patient identification was confirmed and verbal consent was obtained. This procedure was performed by Cassandra Cable, MD at 8:48 AM. Site: right hand Sterile procedures observed Needle size: 23 Anesthetic used (type and amt): lidocaine 1% 40ml Blade size: 11 Drainage: moderate Complexity: Complex Packing none Site anesthetized, incision made over site, wound drained and explored loculations, rinsed  with copious amounts of normal saline,covered with sterile dressing.  Pt tolerated procedure well without complications.  Instructions for care discussed verbally and pt provided with additional written instructions for homecare and f/u.   Unable to gain adequate pain control with multiple doses of IV narcotics as well as local anesthesia Pt did have significant drainage of her abscess but will require further  drainage There is no crepitus She has full flexion of right wrist as well as all fingers on right hand No foreign bodies noted.  No signs of trauma. She is not diabetic Unable to embed photo into chart due to technical issues   9:43 AM D/w dr Lenon Curt Will see in office in next 24 hours Discussed strict return precautions with patient  MDM   Final diagnoses:  Abscess of right hand    Nursing notes including past medical history and social history reviewed and considered in documentation   I personally performed the services described in this documentation, which was scribed in my presence. The recorded information has been reviewed and is accurate.        Cassandra Cable, MD 11/04/13 [phone]

## 2013-12-08 ENCOUNTER — Encounter (HOSPITAL_COMMUNITY): Payer: Self-pay | Admitting: Emergency Medicine

## 2014-05-07 ENCOUNTER — Encounter: Payer: Self-pay | Admitting: *Deleted

## 2014-05-18 ENCOUNTER — Ambulatory Visit: Payer: 59 | Admitting: Family Medicine

## 2014-05-21 ENCOUNTER — Encounter: Payer: Self-pay | Admitting: Family Medicine

## 2014-05-21 ENCOUNTER — Ambulatory Visit (INDEPENDENT_AMBULATORY_CARE_PROVIDER_SITE_OTHER): Payer: 59 | Admitting: Family Medicine

## 2014-05-21 VITALS — BP 140/88 | HR 86 | Temp 98.9°F | Resp 18 | Ht 64.0 in | Wt 205.0 lb

## 2014-05-21 DIAGNOSIS — E785 Hyperlipidemia, unspecified: Secondary | ICD-10-CM

## 2014-05-21 DIAGNOSIS — Z720 Tobacco use: Secondary | ICD-10-CM

## 2014-05-21 DIAGNOSIS — Z716 Tobacco abuse counseling: Secondary | ICD-10-CM

## 2014-05-21 DIAGNOSIS — Z7189 Other specified counseling: Secondary | ICD-10-CM | POA: Diagnosis not present

## 2014-05-21 DIAGNOSIS — F329 Major depressive disorder, single episode, unspecified: Secondary | ICD-10-CM | POA: Diagnosis not present

## 2014-05-21 DIAGNOSIS — F32A Depression, unspecified: Secondary | ICD-10-CM

## 2014-05-21 DIAGNOSIS — I10 Essential (primary) hypertension: Secondary | ICD-10-CM | POA: Diagnosis not present

## 2014-05-21 DIAGNOSIS — F17200 Nicotine dependence, unspecified, uncomplicated: Secondary | ICD-10-CM | POA: Insufficient documentation

## 2014-05-21 DIAGNOSIS — Z7689 Persons encountering health services in other specified circumstances: Secondary | ICD-10-CM

## 2014-05-21 MED ORDER — SIMVASTATIN 20 MG PO TABS: 20.0000 mg | ORAL_TABLET | Freq: Every day | ORAL | Status: DC

## 2014-05-21 MED ORDER — VARENICLINE TARTRATE 0.5 MG X 11 & 1 MG X 42 PO MISC: ORAL | Status: DC

## 2014-05-21 MED ORDER — CITALOPRAM HYDROBROMIDE 20 MG PO TABS: 20.0000 mg | ORAL_TABLET | Freq: Every day | ORAL | Status: DC

## 2014-05-21 MED ORDER — LISINOPRIL-HYDROCHLOROTHIAZIDE 20-25 MG PO TABS: 1.0000 | ORAL_TABLET | Freq: Every day | ORAL | Status: DC

## 2014-05-21 NOTE — Progress Notes (Signed)
Subjective:    Patient ID: Cassandra Goodman, female    DOB: March 13, 1965, 49 y.o.   MRN: [id number]  HPI She is here today to establish care.  She is a very pleasant 49 year old white female. Past medical history includes hypertension, hyperlipidemia, depression, and tobacco abuse currently she states that her depression is well controlled on the citalopram 20 mg a day. Unfortunately her husband was recently diagnosed with bladder cancer as well as cancer in the spine. She is aware of the poor prognosis of this diagnosis but she accepts reality of this and is dealing with it quite well. She is very interested in smoking cessation. We had a long discussion today regarding options for smoking cessation and she is very interested in Chantix. I explained the risk of increased suicidal thoughts and depression but she feels like her mood is well controlled at the present point and she is motivated to quit due to the recent cancer diagnosis and her family. Her blood pressure today is borderline at 140/88. She denies any chest pain shortness of breath or dyspnea on exertion. She is overdue for fasting lab work. She is also due for mammogram as well as a Pap smear. She would like to defer the mammogram at the present time because she is busy taking her husband to cancer treatments. She would also like to defer the Pap smear the present time Past Medical History  Diagnosis Date  . Hypertension   . Hyperlipidemia   . Allergy     SEASONAL  . Depression   . Smoker    Past Surgical History  Procedure Laterality Date  . Laparoscopic tubal ligation    . Tubal ligation     Current Outpatient Prescriptions on File Prior to Visit  Medication Sig Dispense Refill  . aspirin 81 MG tablet Take 81 mg by mouth daily.    . Multiple Vitamin (MULTIVITAMIN WITH MINERALS) TABS tablet Take 1 tablet by mouth daily.     No current facility-administered medications on file prior to visit.   Allergies  Allergen Reactions  .  Morphine And Related Shortness Of Breath and Itching    Sweating   History   Social History  . Marital Status: Married    Spouse Name: Nithya Meriweather  . Number of Children: 2  . Years of Education: 12   Occupational History  . unemployed    Social History Main Topics  . Smoking status: Current Every Day Smoker -- 1.00 packs/day for 20 years    Types: Cigarettes  . Smokeless tobacco: Never Used     Comment: Pt had quit but restarted  . Alcohol Use: Yes     Comment: OCCASIONALLY  . Drug Use: No  . Sexual Activity: No   Other Topics Concern  . Not on file   Social History Narrative      Review of Systems  All other systems reviewed and are negative.      Objective:   Physical Exam  Constitutional: She appears well-developed and well-nourished.  Neck: Neck supple. No JVD present. No thyromegaly present.  Cardiovascular: Normal rate, regular rhythm and normal heart sounds.   No murmur heard. Pulmonary/Chest: Effort normal and breath sounds normal. No respiratory distress. She has no wheezes. She has no rales.  Abdominal: Soft. Bowel sounds are normal. She exhibits no distension. There is no tenderness. There is no rebound and no guarding.  Musculoskeletal: She exhibits no edema.  Lymphadenopathy:    She has no cervical adenopathy.  Vitals reviewed.         Assessment & Plan:  Encounter for smoking cessation counseling - Plan: varenicline (CHANTIX STARTING MONTH PAK) 0.5 MG X 11 & 1 MG X 42 tablet  Establishing care with new doctor, encounter for  Benign essential HTN  HLD (hyperlipidemia)  Depression  I would like the patient to return fasting for a CBC, CMP, fasting lipid panel. We had a long discussion regarding whether or not this was the right time to quit smoking given the stress her family is going through. However she is very motivated and would like to try as Chantix starter pack. Therefore I gave her a prescription for this. Her blood pressures  acceptable today. I would like her to return fasting for lab work. Ideally I would like her LDL cholesterol below 100 given her smoking and hypertension and hyperlipidemia. I will continue citalopram. We consulted perform a mammogram and a Pap smear when her schedule permits. I recommended that both of these be performed this year.

## 2014-05-22 ENCOUNTER — Other Ambulatory Visit: Payer: 59

## 2014-05-22 DIAGNOSIS — E785 Hyperlipidemia, unspecified: Secondary | ICD-10-CM

## 2014-05-22 LAB — CBC WITH DIFFERENTIAL/PLATELET
Basophils Absolute: 0.1 10*3/uL (ref 0.0–0.1)
Basophils Relative: 1 % (ref 0–1)
Eosinophils Absolute: 0.1 10*3/uL (ref 0.0–0.7)
Eosinophils Relative: 2 % (ref 0–5)
HEMATOCRIT: 43 % (ref 36.0–46.0)
Hemoglobin: 14.9 g/dL (ref 12.0–15.0)
LYMPHS ABS: 1.7 10*3/uL (ref 0.7–4.0)
LYMPHS PCT: 28 % (ref 12–46)
MCH: 32.4 pg (ref 26.0–34.0)
MCHC: 34.7 g/dL (ref 30.0–36.0)
MCV: 93.5 fL (ref 78.0–100.0)
MONO ABS: 0.7 10*3/uL (ref 0.1–1.0)
MPV: 11 fL (ref 8.6–12.4)
Monocytes Relative: 11 % (ref 3–12)
Neutro Abs: 3.6 10*3/uL (ref 1.7–7.7)
Neutrophils Relative %: 58 % (ref 43–77)
Platelets: 226 10*3/uL (ref 150–400)
RBC: 4.6 MIL/uL (ref 3.87–5.11)
RDW: 14.3 % (ref 11.5–15.5)
WBC: 6.2 10*3/uL (ref 4.0–10.5)

## 2014-05-22 LAB — COMPLETE METABOLIC PANEL WITH GFR
ALBUMIN: 4 g/dL (ref 3.5–5.2)
ALK PHOS: 62 U/L (ref 39–117)
ALT: 36 U/L — AB (ref 0–35)
AST: 32 U/L (ref 0–37)
BUN: 9 mg/dL (ref 6–23)
CO2: 26 mEq/L (ref 19–32)
Calcium: 9.6 mg/dL (ref 8.4–10.5)
Chloride: 103 mEq/L (ref 96–112)
Creat: 0.63 mg/dL (ref 0.50–1.10)
GFR, Est African American: >89 mL/min
GFR, Est Non African American: >89 mL/min
GLUCOSE: 93 mg/dL (ref 70–99)
POTASSIUM: 4.3 meq/L (ref 3.5–5.3)
Sodium: 137 mEq/L (ref 135–145)
Total Bilirubin: 0.5 mg/dL (ref 0.2–1.2)
Total Protein: 6.6 g/dL (ref 6.0–8.3)

## 2014-05-22 LAB — LIPID PANEL
Cholesterol: 219 mg/dL — ABNORMAL HIGH (ref 0–200)
HDL: 53 mg/dL (ref >=46)
LDL CALC: 135 mg/dL — AB (ref 0–99)
Total CHOL/HDL Ratio: 4.1 Ratio
Triglycerides: 157 mg/dL — ABNORMAL HIGH (ref <150)
VLDL: 31 mg/dL (ref 0–40)

## 2014-05-27 ENCOUNTER — Other Ambulatory Visit: Payer: Self-pay | Admitting: Family Medicine

## 2014-05-27 DIAGNOSIS — Z79899 Other long term (current) drug therapy: Secondary | ICD-10-CM

## 2014-05-27 DIAGNOSIS — E785 Hyperlipidemia, unspecified: Secondary | ICD-10-CM

## 2014-05-27 MED ORDER — ATORVASTATIN CALCIUM 40 MG PO TABS: 40.0000 mg | ORAL_TABLET | Freq: Every day | ORAL | Status: DC

## 2014-06-02 ENCOUNTER — Telehealth: Payer: Self-pay | Admitting: *Deleted

## 2014-06-02 NOTE — Telephone Encounter (Signed)
Received request from pharmacy for PA on Chantix.  PA submitted.   Dx: Z72.0- Personal history of Smoking

## 2014-06-02 NOTE — Telephone Encounter (Signed)
Received PA determination.   PA approved 06/02/2015- 06/02/2015.  PA- [id number].  Pharmacy made aware.

## 2014-06-30 ENCOUNTER — Other Ambulatory Visit: Payer: Self-pay | Admitting: Family Medicine

## 2014-06-30 DIAGNOSIS — Z716 Tobacco abuse counseling: Secondary | ICD-10-CM

## 2014-06-30 NOTE — Telephone Encounter (Signed)
Pharmacy requesting refill Chantix starter pack??  Called patient, has been using starter pack.  Now needs continuation RX.  OK?

## 2014-06-30 NOTE — Telephone Encounter (Signed)
Refill a CONTINUING MONTH pack, (rfx2)

## 2014-07-01 MED ORDER — VARENICLINE TARTRATE 1 MG PO TABS: 1.0000 mg | ORAL_TABLET | Freq: Two times a day (BID) | ORAL | Status: DC

## 2014-07-01 NOTE — Telephone Encounter (Signed)
rx to pharmacy 

## 2014-09-21 ENCOUNTER — Other Ambulatory Visit: Payer: Self-pay | Admitting: Orthopaedic Surgery

## 2014-09-21 DIAGNOSIS — M79671 Pain in right foot: Secondary | ICD-10-CM

## 2014-09-21 DIAGNOSIS — S9031XS Contusion of right foot, sequela: Secondary | ICD-10-CM

## 2014-09-23 ENCOUNTER — Ambulatory Visit
Admission: RE | Admit: 2014-09-23 | Discharge: 2014-09-23 | Disposition: A | Discharge status: Home / Self Care | Payer: Worker's Compensation | Source: Ambulatory Visit | Attending: Orthopaedic Surgery | Admitting: Orthopaedic Surgery

## 2014-09-23 DIAGNOSIS — M79671 Pain in right foot: Secondary | ICD-10-CM

## 2014-09-23 DIAGNOSIS — S9031XS Contusion of right foot, sequela: Secondary | ICD-10-CM

## 2014-10-28 LAB — HM MAMMOGRAPHY

## 2014-11-11 ENCOUNTER — Other Ambulatory Visit: Payer: Self-pay | Admitting: Family Medicine

## 2014-11-12 ENCOUNTER — Ambulatory Visit (INDEPENDENT_AMBULATORY_CARE_PROVIDER_SITE_OTHER): Payer: 59 | Admitting: Family Medicine

## 2014-11-12 ENCOUNTER — Encounter: Payer: Self-pay | Admitting: Family Medicine

## 2014-11-12 VITALS — BP 130/74 | HR 68 | Temp 98.1°F | Resp 16 | Wt 221.0 lb

## 2014-11-12 DIAGNOSIS — Z23 Encounter for immunization: Secondary | ICD-10-CM

## 2014-11-12 DIAGNOSIS — K219 Gastro-esophageal reflux disease without esophagitis: Secondary | ICD-10-CM

## 2014-11-12 LAB — CBC WITH DIFFERENTIAL/PLATELET
BASOS ABS: 0 10*3/uL (ref 0.0–0.1)
Basophils Relative: 0 % (ref 0–1)
EOS PCT: 2 % (ref 0–5)
Eosinophils Absolute: 0.1 10*3/uL (ref 0.0–0.7)
HEMATOCRIT: 42.2 % (ref 36.0–46.0)
Hemoglobin: 14.1 g/dL (ref 12.0–15.0)
LYMPHS ABS: 2.2 10*3/uL (ref 0.7–4.0)
LYMPHS PCT: 34 % (ref 12–46)
MCH: 31 pg (ref 26.0–34.0)
MCHC: 33.4 g/dL (ref 30.0–36.0)
MCV: 92.7 fL (ref 78.0–100.0)
MONO ABS: 0.5 10*3/uL (ref 0.1–1.0)
MONOS PCT: 8 % (ref 3–12)
MPV: 10.8 fL (ref 8.6–12.4)
Neutro Abs: 3.6 10*3/uL (ref 1.7–7.7)
Neutrophils Relative %: 56 % (ref 43–77)
Platelets: 203 10*3/uL (ref 150–400)
RBC: 4.55 MIL/uL (ref 3.87–5.11)
RDW: 13.4 % (ref 11.5–15.5)
WBC: 6.5 10*3/uL (ref 4.0–10.5)

## 2014-11-12 LAB — COMPLETE METABOLIC PANEL WITH GFR
ALT: 22 U/L (ref 6–29)
AST: 20 U/L (ref 10–35)
Albumin: 4.1 g/dL (ref 3.6–5.1)
Alkaline Phosphatase: 73 U/L (ref 33–115)
BILIRUBIN TOTAL: 0.5 mg/dL (ref 0.2–1.2)
BUN: 11 mg/dL (ref 7–25)
CALCIUM: 9.7 mg/dL (ref 8.6–10.2)
CHLORIDE: 102 mmol/L (ref 98–110)
CO2: 29 mmol/L (ref 20–31)
CREATININE: 0.62 mg/dL (ref 0.50–1.10)
GFR, Est Non African American: >89 mL/min (ref >=60)
Glucose, Bld: 85 mg/dL (ref 70–99)
Potassium: 3.9 mmol/L (ref 3.5–5.3)
Sodium: 140 mmol/L (ref 135–146)
Total Protein: 6.8 g/dL (ref 6.1–8.1)

## 2014-11-12 NOTE — Progress Notes (Signed)
Subjective:    Patient ID: Cassandra Goodman, female    DOB: 04-06-1965, 49 y.o.   MRN: [id number]  HPI 05/2014 She is here today to establish care.  She is a very pleasant 49 year old white female. Past medical history includes hypertension, hyperlipidemia, depression, and tobacco abuse currently she states that her depression is well controlled on the citalopram 20 mg a day. Unfortunately her husband was recently diagnosed with bladder cancer as well as cancer in the spine. She is aware of the poor prognosis of this diagnosis but she accepts reality of this and is dealing with it quite well. She is very interested in smoking cessation. We had a long discussion today regarding options for smoking cessation and she is very interested in Chantix. I explained the risk of increased suicidal thoughts and depression but she feels like her mood is well controlled at the present point and she is motivated to quit due to the recent cancer diagnosis and her family. Her blood pressure today is borderline at 140/88. She denies any chest pain shortness of breath or dyspnea on exertion. She is overdue for fasting lab work. She is also due for mammogram as well as a Pap smear. She would like to defer the mammogram at the present time because she is busy taking her husband to cancer treatments. She would also like to defer the Pap smear the present time.  At that time, my plan ws: I would like the patient to return fasting for a CBC, CMP, fasting lipid panel. We had a long discussion regarding whether or not this was the right time to quit smoking given the stress her family is going through. However she is very motivated and would like to try as Chantix starter pack. Therefore I gave her a prescription for this. Her blood pressures acceptable today. I would like her to return fasting for lab work. Ideally I would like her LDL cholesterol below 100 given her smoking and hypertension and hyperlipidemia. I will continue  citalopram. We consulted perform a mammogram and a Pap smear when her schedule permits. I recommended that both of these be performed this year.  11/12/14 Since when I last saw the patient she has quit smoking. Unfortunate she gained substantial weight with smoking cessation. Since gaining the weight, she reports daily acid reflux every time she eats. She reports indigestion and central chest discomfort. She denies any angina, shortness of breath, or dyspnea on exertion. She denies any black tarry stools or bright red blood per rectum. Past Medical History  Diagnosis Date  . Hypertension   . Hyperlipidemia   . Allergy     SEASONAL  . Depression   . Smoker    Past Surgical History  Procedure Laterality Date  . Laparoscopic tubal ligation    . Tubal ligation     Current Outpatient Prescriptions on File Prior to Visit  Medication Sig Dispense Refill  . aspirin 81 MG tablet Take 81 mg by mouth daily.    Marland Kitchen atorvastatin (LIPITOR) 40 MG tablet TAKE ONE TABLET BY MOUTH ONCE DAILY 30 tablet 0  . citalopram (CELEXA) 20 MG tablet TAKE ONE TABLET BY MOUTH ONCE DAILY 30 tablet 0  . lisinopril-hydrochlorothiazide (PRINZIDE,ZESTORETIC) 20-25 MG tablet TAKE ONE TABLET BY MOUTH ONCE DAILY 30 tablet 0  . Multiple Vitamin (MULTIVITAMIN WITH MINERALS) TABS tablet Take 1 tablet by mouth daily.     No current facility-administered medications on file prior to visit.   Allergies  Allergen Reactions  .  Morphine And Related Shortness Of Breath and Itching    Sweating   Social History   Social History  . Marital Status: Married    Spouse Name: Marializ Ferrebee  . Number of Children: 2  . Years of Education: 12   Occupational History  . unemployed    Social History Main Topics  . Smoking status: Former Smoker -- 1.00 packs/day for 20 years    Types: Cigarettes  . Smokeless tobacco: Former Systems developer    Quit date: 06/11/2014  . Alcohol Use: 0.0 oz/week    0 Standard drinks or equivalent per week      Comment: OCCASIONALLY  . Drug Use: No  . Sexual Activity: No   Other Topics Concern  . Not on file   Social History Narrative      Review of Systems  All other systems reviewed and are negative.      Objective:   Physical Exam  Constitutional: She appears well-developed and well-nourished.  Neck: Neck supple. No JVD present. No thyromegaly present.  Cardiovascular: Normal rate, regular rhythm and normal heart sounds.   No murmur heard. Pulmonary/Chest: Effort normal and breath sounds normal. No respiratory distress. She has no wheezes. She has no rales.  Abdominal: Soft. Bowel sounds are normal. She exhibits no distension. There is no tenderness. There is no rebound and no guarding.  Musculoskeletal: She exhibits no edema.  Lymphadenopathy:    She has no cervical adenopathy.  Vitals reviewed.         Assessment & Plan:  Gastroesophageal reflux disease without esophagitis - Plan: H. pylori breath test, CBC with Differential/Platelet, COMPLETE METABOLIC PANEL WITH GFR  Begin NExium 40 mg poqday and recheck in one week. I will also check an H. pylori breath test.

## 2014-11-12 NOTE — Addendum Note (Signed)
Addended by: Shary Decamp B on: 11/12/2014 02:50 PM   Modules accepted: Orders

## 2014-11-13 ENCOUNTER — Encounter: Payer: Self-pay | Admitting: Family Medicine

## 2014-11-13 LAB — H. PYLORI BREATH TEST: H. pylori Breath Test: DETECTED — AB

## 2014-11-16 ENCOUNTER — Other Ambulatory Visit: Payer: Self-pay

## 2014-11-16 ENCOUNTER — Other Ambulatory Visit: Payer: Self-pay | Admitting: Family Medicine

## 2014-11-16 MED ORDER — AMOXICILL-CLARITHRO-LANSOPRAZ PO MISC: Freq: Two times a day (BID) | ORAL | Status: DC

## 2014-11-17 ENCOUNTER — Telehealth: Payer: Self-pay | Admitting: Family Medicine

## 2014-11-17 ENCOUNTER — Encounter: Payer: Self-pay | Admitting: *Deleted

## 2014-11-17 NOTE — Telephone Encounter (Signed)
Abby wilson calling from patients employer calling to speak with you regarding Ari's fmla papers.  Please call her at [phone]

## 2014-11-18 ENCOUNTER — Telehealth: Payer: Self-pay | Admitting: *Deleted

## 2014-11-18 NOTE — Telephone Encounter (Signed)
Received request from pharmacy for PA on PrevPac.  PA submitted.   Dx: B51.81- H Pylori Gastritis.

## 2014-11-18 NOTE — Telephone Encounter (Signed)
lmtrc

## 2014-11-23 MED ORDER — AMOXICILL-CLARITHRO-LANSOPRAZ PO MISC
ORAL | Status: DC
Start: 2014-11-23 — End: 2014-12-28

## 2014-11-23 NOTE — Telephone Encounter (Signed)
Sent over RX for Split up version of the PrevPac as we have not heard anything back from insurance yet.

## 2014-11-23 NOTE — Telephone Encounter (Signed)
lmtrc

## 2014-11-27 NOTE — Telephone Encounter (Signed)
I have left numerous messages to Abby with no return call, called again today will no return call

## 2014-12-02 ENCOUNTER — Telehealth: Payer: Self-pay | Admitting: Family Medicine

## 2014-12-02 NOTE — Telephone Encounter (Signed)
Patient wants to know what kind of infection she had in her stomach  [phone]

## 2014-12-02 NOTE — Telephone Encounter (Signed)
Called and explained about H.Pylori

## 2014-12-13 ENCOUNTER — Other Ambulatory Visit: Payer: Self-pay | Admitting: Family Medicine

## 2014-12-14 NOTE — Telephone Encounter (Signed)
Refill appropriate and filled per protocol. 

## 2014-12-28 ENCOUNTER — Encounter: Payer: Self-pay | Admitting: Family Medicine

## 2014-12-28 ENCOUNTER — Ambulatory Visit (INDEPENDENT_AMBULATORY_CARE_PROVIDER_SITE_OTHER): Payer: 59 | Admitting: Family Medicine

## 2014-12-28 VITALS — BP 128/70 | HR 78 | Temp 97.9°F | Resp 20 | Ht 64.0 in | Wt 236.0 lb

## 2014-12-28 DIAGNOSIS — K219 Gastro-esophageal reflux disease without esophagitis: Secondary | ICD-10-CM

## 2014-12-28 DIAGNOSIS — G4700 Insomnia, unspecified: Secondary | ICD-10-CM | POA: Diagnosis not present

## 2014-12-28 MED ORDER — ALPRAZOLAM 1 MG PO TABS: 1.0000 mg | ORAL_TABLET | Freq: Every evening | ORAL | Status: DC | PRN

## 2014-12-28 NOTE — Progress Notes (Signed)
Subjective:    Patient ID: Cassandra Goodman, female    DOB: 04-29-1965, 49 y.o.   MRN: [id number]  HPI 05/2014 She is here today to establish care.  She is a very pleasant 49 year old white female. Past medical history includes hypertension, hyperlipidemia, depression, and tobacco abuse currently she states that her depression is well controlled on the citalopram 20 mg a day. Unfortunately her husband was recently diagnosed with bladder cancer as well as cancer in the spine. She is aware of the poor prognosis of this diagnosis but she accepts reality of this and is dealing with it quite well. She is very interested in smoking cessation. We had a long discussion today regarding options for smoking cessation and she is very interested in Chantix. I explained the risk of increased suicidal thoughts and depression but she feels like her mood is well controlled at the present point and she is motivated to quit due to the recent cancer diagnosis and her family. Her blood pressure today is borderline at 140/88. She denies any chest pain shortness of breath or dyspnea on exertion. She is overdue for fasting lab work. She is also due for mammogram as well as a Pap smear. She would like to defer the mammogram at the present time because she is busy taking her husband to cancer treatments. She would also like to defer the Pap smear the present time.  At that time, my plan ws: I would like the patient to return fasting for a CBC, CMP, fasting lipid panel. We had a long discussion regarding whether or not this was the right time to quit smoking given the stress her family is going through. However she is very motivated and would like to try as Chantix starter pack. Therefore I gave her a prescription for this. Her blood pressures acceptable today. I would like her to return fasting for lab work. Ideally I would like her LDL cholesterol below 100 given her smoking and hypertension and hyperlipidemia. I will continue  citalopram. We consulted perform a mammogram and a Pap smear when her schedule permits. I recommended that both of these be performed this year.  11/12/14 Since when I last saw the patient she has quit smoking. Unfortunate she gained substantial weight with smoking cessation. Since gaining the weight, she reports daily acid reflux every time she eats. She reports indigestion and central chest discomfort. She denies any angina, shortness of breath, or dyspnea on exertion. She denies any black tarry stools or bright red blood per rectum.  AT that time, my plan was: Begin NExium 40 mg poqday and recheck in one week. I will also check an H. pylori breath test.  12/28/14 H pylori breath test was positive. Patient was treated with Prevpac for 2 weeks. She continue Nexium 40 mg a day thereafter. Symptoms have not improved at all. She continues to have daily reflux with burning and aching in her throat along with indigestion. She denies any weight loss. She denies any black tarry stools. She does complain of insomnia which she attributes to anxiety. She is unable to sleep at night as she lies in bed worrying about her husband's health care and her medical problems. He also watches TV in bed at night and keeps her awake. He keeps the bedroom warm so she has a difficult time sleeping. Furthermore she is not getting any exercise. Past Medical History  Diagnosis Date  . Hypertension   . Hyperlipidemia   . Allergy  SEASONAL  . Depression   . Smoker    Past Surgical History  Procedure Laterality Date  . Laparoscopic tubal ligation    . Tubal ligation     Current Outpatient Prescriptions on File Prior to Visit  Medication Sig Dispense Refill  . aspirin 81 MG tablet Take 81 mg by mouth daily.    Marland Kitchen atorvastatin (LIPITOR) 40 MG tablet TAKE ONE TABLET BY MOUTH ONCE DAILY 30 tablet 0  . citalopram (CELEXA) 20 MG tablet TAKE ONE TABLET BY MOUTH ONCE DAILY 30 tablet 0  . lisinopril-hydrochlorothiazide  (PRINZIDE,ZESTORETIC) 20-25 MG tablet TAKE ONE TABLET BY MOUTH ONCE DAILY 30 tablet 0  . Multiple Vitamin (MULTIVITAMIN WITH MINERALS) TABS tablet Take 1 tablet by mouth daily.     No current facility-administered medications on file prior to visit.   Allergies  Allergen Reactions  . Morphine And Related Shortness Of Breath and Itching    Sweating   Social History   Social History  . Marital Status: Married    Spouse Name: Lise Kovalchik  . Number of Children: 2  . Years of Education: 12   Occupational History  . unemployed    Social History Main Topics  . Smoking status: Former Smoker -- 1.00 packs/day for 20 years    Types: Cigarettes  . Smokeless tobacco: Former Systems developer    Quit date: 06/11/2014  . Alcohol Use: 0.0 oz/week    0 Standard drinks or equivalent per week     Comment: OCCASIONALLY  . Drug Use: No  . Sexual Activity: No   Other Topics Concern  . Not on file   Social History Narrative      Review of Systems  All other systems reviewed and are negative.      Objective:   Physical Exam  Constitutional: She appears well-developed and well-nourished.  Neck: Neck supple. No JVD present. No thyromegaly present.  Cardiovascular: Normal rate, regular rhythm and normal heart sounds.   No murmur heard. Pulmonary/Chest: Effort normal and breath sounds normal. No respiratory distress. She has no wheezes. She has no rales.  Abdominal: Soft. Bowel sounds are normal. She exhibits no distension. There is no tenderness. There is no rebound and no guarding.  Musculoskeletal: She exhibits no edema.  Lymphadenopathy:    She has no cervical adenopathy.  Vitals reviewed.         Assessment & Plan:  Insomnia - Plan: ALPRAZolam (XANAX) 1 MG tablet  Gastroesophageal reflux disease without esophagitis - Plan: Ambulatory referral to Gastroenterology discontinue Nexium and replaced with dexilant 60 mg poqam.  If no better in 2 weeks, consult GI for EGD. Also recommended  that she elevate the head of her bed. I recommended that she sleep in a separate room with no TV's in the dark and cool and quiet place. I recommended white noise to help her fall asleep. I also recommended increasing her aerobic exercise during the day to help her sleep at night. She can also use Xanax 0.5 mg to 1 mg at night by mouth daily at bedtime when necessary insomnia. After 2 weeks, I would like her to discontinue the Xanax. Hopefully once we reset her sleep cycle, she will be able to sleep on her own without using potentially habit-forming medication.

## 2015-01-06 ENCOUNTER — Encounter: Payer: Self-pay | Admitting: Gastroenterology

## 2015-01-14 ENCOUNTER — Other Ambulatory Visit: Payer: Self-pay | Admitting: Family Medicine

## 2015-01-14 NOTE — Telephone Encounter (Signed)
Medication called to pharmacy. 

## 2015-01-14 NOTE — Telephone Encounter (Signed)
ok 

## 2015-01-14 NOTE — Telephone Encounter (Signed)
?   OK to Refill xanax 

## 2015-01-15 ENCOUNTER — Other Ambulatory Visit: Payer: Self-pay | Admitting: Family Medicine

## 2015-01-15 MED ORDER — DEXLANSOPRAZOLE 60 MG PO CPDR: 60.0000 mg | DELAYED_RELEASE_CAPSULE | Freq: Every day | ORAL | Status: DC

## 2015-01-15 NOTE — Telephone Encounter (Signed)
[facility]   Patient was given samples of dexilant when she was here last, would like to know if an rx can be called in for this if possible  [phone]

## 2015-01-15 NOTE — Telephone Encounter (Signed)
RX sent to pharmacy  

## 2015-01-28 ENCOUNTER — Ambulatory Visit: Payer: Self-pay | Admitting: Gastroenterology

## 2015-02-14 ENCOUNTER — Other Ambulatory Visit: Payer: Self-pay | Admitting: Family Medicine

## 2015-02-24 ENCOUNTER — Other Ambulatory Visit: Payer: Self-pay | Admitting: Family Medicine

## 2015-02-25 ENCOUNTER — Other Ambulatory Visit: Payer: Self-pay

## 2015-02-25 ENCOUNTER — Encounter: Payer: Self-pay | Admitting: Gastroenterology

## 2015-02-25 ENCOUNTER — Ambulatory Visit (INDEPENDENT_AMBULATORY_CARE_PROVIDER_SITE_OTHER): Payer: 59 | Admitting: Gastroenterology

## 2015-02-25 VITALS — BP 129/72 | HR 95 | Temp 97.1°F | Ht 64.0 in | Wt 233.6 lb

## 2015-02-25 DIAGNOSIS — Z1211 Encounter for screening for malignant neoplasm of colon: Secondary | ICD-10-CM

## 2015-02-25 DIAGNOSIS — R1013 Epigastric pain: Secondary | ICD-10-CM

## 2015-02-25 NOTE — Progress Notes (Signed)
Subjective:    Patient ID: Cassandra Goodman, female    DOB: Oct 27, 1965, 50 y.o.   MRN: [id number]  McNeil, MD  HPI Had onset dyspepsia.  HEARTBURN: BURNING CHEST PAIN/TROUBLE SWALLOWING WAS OUT OF CONTROL SINCE OCT. TOOK ABX FOR 2 WEEKS. FELT BETTER AFTER ABX & [place]. FEELS GASSY, BLOATED, AND NAUSEATED. NAUSEA COMES AND GOES. 3-4 TIMES/WEEK. LASTS HRS IF AT WORK. WORKS AT [employer]. BMs: SOME DAYS BETTER THAN OTHERS(#4-5). RARE UPPER ABDOMINAL OFF AND ON. BETTER WITH DEXILANT AND TIME. STOPPED SMOKING FOR 7 MOS. SMOKING: 1.5 PK/DAY-BURNS A LOT. HAS CHANTIX.  PT DENIES FEVER, CHILLS, HEMATOCHEZIA, melena, diarrhea, CHEST PAIN, SHORTNESS OF BREATH, CHANGE IN BOWEL IN HABITS, constipation, abdominal pain, problems with sedation, OR heartburn or indigestion. WEIGHT LOSS: NO. APPETITE: GOOD   Past Medical History  Diagnosis Date  . Hypertension   . Hyperlipidemia   . Allergy     SEASONAL  . Depression   . Smoker    Past Surgical History  Procedure Laterality Date  . Laparoscopic tubal ligation    . Tubal ligation     Allergies  Allergen Reactions  . Morphine And Related Shortness Of Breath and Itching    Sweating   Current Outpatient Prescriptions  Medication Sig Dispense Refill  . ALPRAZolam (XANAX) 1 MG tablet TAKE ONE PO QHS PRN FOR ANXIETY OR SLEEP    . atorvastatin (LIPITOR) 40 MG tablet TAKE ONE TABLET BY MOUTH ONCE DAILY    . citalopram (CELEXA) 20 MG tablet TAKE ONE TABLET BY MOUTH ONCE DAILY    . dexlansoprazole (DEXILANT) 60 MG capsule Take 1 capsule (60 mg total) by mouth daily.    Marland Kitchen PRINZIDE,ZESTORETIC) 20-25 MG tablet TAKE ONE TABLET BY MOUTH ONCE DAILY    . aspirin 81 MG tablet Take 81 mg by mouth daily. Reported on 02/25/2015    . Multiple Vitamin Take 1 tablet by mouth daily. Reported on 02/25/2015     Family History  Problem Relation Age of Onset  . Depression Mother   . Diabetes Mother   . Cancer Mother     ovarian  . Depression Maternal  Grandmother   . Diabetes Maternal Grandmother   . Diabetes Father   . COPD Maternal Grandfather     liver, heavy alcohol  . Cancer Maternal Aunt     breast  . Cancer Maternal Uncle     throat, smoker    Social History   Social History  . Marital Status: Married    Spouse Name: Amanah Garger  . Number of Children: 2  . Years of Education: 12   Occupational History  . unemployed    Social History Main Topics  . Smoking status: Current Every Day Smoker -- 1.50 packs/day for 20 years    Types: Cigarettes  . Smokeless tobacco: Former Systems developer    Quit date: 06/11/2014  . Alcohol Use: 0.0 oz/week    0 Standard drinks or equivalent per week     Comment: OCCASIONALLY  . Drug Use: No  . Sexual Activity: No   Review of Systems PER HPI OTHERWISE ALL SYSTEMS ARE NEGATIVE.    Objective:   Physical Exam  Constitutional: She is oriented to person, place, and time. She appears well-developed and well-nourished. No distress.  HENT:  Head: Normocephalic and atraumatic.  Mouth/Throat: Oropharynx is clear and moist. No oropharyngeal exudate.  Eyes: Pupils are equal, round, and reactive to light. No scleral icterus.  Neck: Normal range of motion. Neck supple.  Cardiovascular: Normal rate, regular rhythm and normal heart sounds.   Pulmonary/Chest: Effort normal and breath sounds normal. No respiratory distress.  Abdominal: Soft. Bowel sounds are normal. She exhibits no distension. There is tenderness. There is no rebound and no guarding.  MILD RLQ TTP  Musculoskeletal: She exhibits no edema.  BRACE ON R ANKLE  Lymphadenopathy:    She has no cervical adenopathy.  Neurological: She is alert and oriented to person, place, and time.  NO FOCAL DEFICITS  Psychiatric: She has a normal mood and affect.  Vitals reviewed.     Assessment & Plan:

## 2015-02-25 NOTE — Telephone Encounter (Signed)
Open in error

## 2015-02-25 NOTE — Patient Instructions (Addendum)
CONTINUE YOUR WEIGHT LOSS EFFORTS. LOSE 10 POUNDS.  DRINK WATER TO KEEP YOUR URINE LIGHT YELLOW.  FOLLOW A LOW FAT DIET. MEATS SHOULD BE BAKED, BROILED, OR BOILED. Marland KitchenAVOID FRIED FOODS. SEE INFO BELOW.  CONTINUE DEXILANT.  UPPER ENDOSCOPY TO EVALUATE YOUR CHEST AND UPPER ABDOMINAL PAIN.  FOLLOW UP IN 6 MOS.   Low-Fat Diet BREADS, CEREALS, PASTA, RICE, DRIED PEAS, AND BEANS These products are high in carbohydrates and most are low in fat. Therefore, they can be increased in the diet as substitutes for fatty foods. They too, however, contain calories and should not be eaten in excess. Cereals can be eaten for snacks as well as for breakfast.   FRUITS AND VEGETABLES It is good to eat fruits and vegetables. Besides being sources of fiber, both are rich in vitamins and some minerals. They help you get the daily allowances of these nutrients. Fruits and vegetables can be used for snacks and desserts.  MEATS Limit lean meat, chicken, [place], and fish to no more than 6 ounces per day. Beef, Pork, and Lamb Use lean cuts of beef, pork, and lamb. Lean cuts include:  Extra-lean ground beef.  Arm roast.  Sirloin tip.  Center-cut ham.  Round steak.  Loin chops.  Rump roast.  Tenderloin.  Trim all fat off the outside of meats before cooking. It is not necessary to severely decrease the intake of red meat, but lean choices should be made. Lean meat is rich in protein and contains a highly absorbable form of iron. Premenopausal women, in particular, should avoid reducing lean red meat because this could increase the risk for low red blood cells (iron-deficiency anemia).  Chicken and [place] These are good sources of protein. The fat of poultry can be reduced by removing the skin and underlying fat layers before cooking. Chicken and [place] can be substituted for lean red meat in the diet. Poultry should not be fried or covered with high-fat sauces. Fish and Shellfish Fish is a good source of protein.  Shellfish contain cholesterol, but they usually are low in saturated fatty acids. The preparation of fish is important. Like chicken and [place], they should not be fried or covered with high-fat sauces. EGGS Egg whites contain no fat or cholesterol. They can be eaten often. Try 1 to 2 egg whites instead of whole eggs in recipes or use egg substitutes that do not contain yolk. MILK AND DAIRY PRODUCTS Use skim or 1% milk instead of 2% or whole milk. Decrease whole milk, natural, and processed cheeses. Use nonfat or low-fat (2%) cottage cheese or low-fat cheeses made from vegetable oils. Choose nonfat or low-fat (1 to 2%) yogurt. Experiment with evaporated skim milk in recipes that call for heavy cream. Substitute low-fat yogurt or low-fat cottage cheese for sour cream in dips and salad dressings. Have at least 2 servings of low-fat dairy products, such as 2 glasses of skim (or 1%) milk each day to help get your daily calcium intake. FATS AND OILS Reduce the total intake of fats, especially saturated fat. Butterfat, lard, and beef fats are high in saturated fat and cholesterol. These should be avoided as much as possible. Vegetable fats do not contain cholesterol, but certain vegetable fats, such as coconut oil, palm oil, and palm kernel oil are very high in saturated fats. These should be limited. These fats are often used in bakery goods, processed foods, popcorn, oils, and nondairy creamers. Vegetable shortenings and some peanut butters contain hydrogenated oils, which are also saturated fats.  Read the labels on these foods and check for saturated vegetable oils. Unsaturated vegetable oils and fats do not raise blood cholesterol. However, they should be limited because they are fats and are high in calories. Total fat should still be limited to 30% of your daily caloric intake. Desirable liquid vegetable oils are corn oil, cottonseed oil, olive oil, canola oil, safflower oil, soybean oil, and sunflower oil.  Peanut oil is not as good, but small amounts are acceptable. Buy a heart-healthy tub margarine that has no partially hydrogenated oils in the ingredients. Mayonnaise and salad dressings often are made from unsaturated fats, but they should also be limited because of their high calorie and fat content. Seeds, nuts, peanut butter, olives, and avocados are high in fat, but the fat is mainly the unsaturated type. These foods should be limited mainly to avoid excess calories and fat. OTHER EATING TIPS Snacks  Most sweets should be limited as snacks. They tend to be rich in calories and fats, and their caloric content outweighs their nutritional value. Some good choices in snacks are graham crackers, melba toast, soda crackers, bagels (no egg), English muffins, fruits, and vegetables. These snacks are preferable to snack crackers, [place] fries, TORTILLA CHIPS, and POTATO chips. Popcorn should be air-popped or cooked in small amounts of liquid vegetable oil. Desserts Eat fruit, low-fat yogurt, and fruit ices instead of pastries, cake, and cookies. Sherbet, angel food cake, gelatin dessert, frozen low-fat yogurt, or other frozen products that do not contain saturated fat (pure fruit juice bars, frozen ice pops) are also acceptable.  COOKING METHODS Choose those methods that use little or no fat. They include: Poaching.  Braising.  Steaming.  Grilling.  Baking.  Stir-frying.  Broiling.  Microwaving.  Foods can be cooked in a nonstick pan without added fat, or use a nonfat cooking spray in regular cookware. Limit fried foods and avoid frying in saturated fat. Add moisture to lean meats by using water, broth, cooking wines, and other nonfat or low-fat sauces along with the cooking methods mentioned above. Soups and stews should be chilled after cooking. The fat that forms on top after a few hours in the refrigerator should be skimmed off. When preparing meals, avoid using excess salt. Salt can contribute to  raising blood pressure in some people.  EATING AWAY FROM HOME Order entres, potatoes, and vegetables without sauces or butter. When meat exceeds the size of a deck of cards (3 to 4 ounces), the rest can be taken home for another meal. Choose vegetable or fruit salads and ask for low-calorie salad dressings to be served on the side. Use dressings sparingly. Limit high-fat toppings, such as bacon, crumbled eggs, cheese, sunflower seeds, and olives. Ask for heart-healthy tub margarine instead of butter.

## 2015-02-25 NOTE — Assessment & Plan Note (Addendum)
ASSOCIATED WITH WEIGH GAIN AND IMPROVED BUT NOT RESOLVED AFTER ABX/DEXILANT. H PYLORI BREATH TEST POSITIVE OCT 2016. DIFFERENTIAL DIAGNOSIS INCLUDES: H PYLORI GASTRITIS RESISTANT TO AMOX/BIAXIN, UNCONTROLLED GERD, LESS LIKELY GE JUNCTION TUMOR, GASTRIC OR PANCREATIC CA OR CHRONIC MESENTERIC ISCHEMIA  CONTINUE YOUR WEIGHT LOSS EFFORTS. LOSE 10 POUNDS. DRINK WATER TO KEEP YOUR URINE LIGHT YELLOW. FOLLOW A LOW FAT DIET. MEATS SHOULD BE BAKED, BROILED, OR BOILED. Marland KitchenAVOID FRIED FOODS. SEE INFO BELOW. UPPER ENDOSCOPY TO EVALUATE YOUR CHEST AND UPPER ABDOMINAL PAIN. DISCUSSED PROCEDURE, BENEFITS, & RISKS: < 1% chance of medication reaction, OR bleeding. PHENERGAN 12.5 MG IV IN PREOP. CONTINUE DEXILANT. CONSIDER CT ABD TO EVALUATE PT IN PT WITH LONGSTANDING TOBACCO HISTORY.  FOLLOW UP IN 6 MOS.  EMR REVIEWED FROM 2014 TO PRESENT.

## 2015-02-25 NOTE — Telephone Encounter (Signed)
ok 

## 2015-02-25 NOTE — Telephone Encounter (Signed)
Medication called to pharmacy. 

## 2015-02-25 NOTE — Assessment & Plan Note (Signed)
AVERAGE RISK. NO WARNING SIGNS/SYMPTOMS  TCS IN 2019

## 2015-02-25 NOTE — Telephone Encounter (Signed)
?   OK to Refill  

## 2015-02-26 NOTE — Progress Notes (Signed)
ON RECALL  °

## 2015-03-01 NOTE — Progress Notes (Signed)
CC'ED TO PCP 

## 2015-03-12 ENCOUNTER — Other Ambulatory Visit: Payer: Self-pay | Admitting: Orthopaedic Surgery

## 2015-03-12 DIAGNOSIS — M79671 Pain in right foot: Secondary | ICD-10-CM

## 2015-03-19 ENCOUNTER — Encounter (HOSPITAL_COMMUNITY): Payer: Self-pay | Admitting: *Deleted

## 2015-03-19 ENCOUNTER — Encounter (HOSPITAL_COMMUNITY)
Admission: RE | Disposition: A | Discharge status: Home / Self Care | Payer: Self-pay | Source: Ambulatory Visit | Attending: Gastroenterology

## 2015-03-19 ENCOUNTER — Ambulatory Visit (HOSPITAL_COMMUNITY)
Admission: RE | Admit: 2015-03-19 | Discharge: 2015-03-19 | Disposition: A | Discharge status: Home / Self Care | Payer: 59 | Source: Ambulatory Visit | Attending: Gastroenterology | Admitting: Gastroenterology

## 2015-03-19 DIAGNOSIS — K219 Gastro-esophageal reflux disease without esophagitis: Secondary | ICD-10-CM | POA: Diagnosis not present

## 2015-03-19 DIAGNOSIS — Z79899 Other long term (current) drug therapy: Secondary | ICD-10-CM | POA: Insufficient documentation

## 2015-03-19 DIAGNOSIS — Z7982 Long term (current) use of aspirin: Secondary | ICD-10-CM | POA: Diagnosis not present

## 2015-03-19 DIAGNOSIS — R1013 Epigastric pain: Secondary | ICD-10-CM

## 2015-03-19 DIAGNOSIS — K2950 Unspecified chronic gastritis without bleeding: Secondary | ICD-10-CM | POA: Insufficient documentation

## 2015-03-19 DIAGNOSIS — K449 Diaphragmatic hernia without obstruction or gangrene: Secondary | ICD-10-CM | POA: Diagnosis not present

## 2015-03-19 DIAGNOSIS — I10 Essential (primary) hypertension: Secondary | ICD-10-CM | POA: Diagnosis not present

## 2015-03-19 DIAGNOSIS — E785 Hyperlipidemia, unspecified: Secondary | ICD-10-CM | POA: Diagnosis not present

## 2015-03-19 DIAGNOSIS — F17210 Nicotine dependence, cigarettes, uncomplicated: Secondary | ICD-10-CM | POA: Diagnosis not present

## 2015-03-19 DIAGNOSIS — R1310 Dysphagia, unspecified: Secondary | ICD-10-CM | POA: Insufficient documentation

## 2015-03-19 HISTORY — PX: ESOPHAGOGASTRODUODENOSCOPY: SHX5428

## 2015-03-19 SURGERY — EGD (ESOPHAGOGASTRODUODENOSCOPY)
Anesthesia: Moderate Sedation

## 2015-03-19 MED ORDER — LIDOCAINE VISCOUS 2 % MT SOLN
OROMUCOSAL | Status: AC
  Filled 2015-03-19: qty 15

## 2015-03-19 MED ORDER — MEPERIDINE HCL 100 MG/ML IJ SOLN
INTRAMUSCULAR | Status: DC | PRN
  Administered 2015-03-19: 25 mg via INTRAVENOUS
  Administered 2015-03-19: 50 mg via INTRAVENOUS
  Administered 2015-03-19: 25 mg via INTRAVENOUS

## 2015-03-19 MED ORDER — LIDOCAINE VISCOUS 2 % MT SOLN
OROMUCOSAL | Status: DC | PRN
  Administered 2015-03-19: 1 via OROMUCOSAL

## 2015-03-19 MED ORDER — MIDAZOLAM HCL 5 MG/5ML IJ SOLN
INTRAMUSCULAR | Status: AC
  Filled 2015-03-19: qty 10

## 2015-03-19 MED ORDER — MIDAZOLAM HCL 5 MG/5ML IJ SOLN
INTRAMUSCULAR | Status: DC | PRN
Start: 2015-03-19 — End: 2015-03-19
  Administered 2015-03-19: 1 mg via INTRAVENOUS
  Administered 2015-03-19 (×2): 2 mg via INTRAVENOUS

## 2015-03-19 MED ORDER — PROMETHAZINE HCL 25 MG/ML IJ SOLN
12.5000 mg | Freq: Once | INTRAMUSCULAR | Status: AC
  Administered 2015-03-19: 12.5 mg via INTRAVENOUS

## 2015-03-19 MED ORDER — SODIUM CHLORIDE 0.9% FLUSH
INTRAVENOUS | Status: AC
  Filled 2015-03-19: qty 10

## 2015-03-19 MED ORDER — SODIUM CHLORIDE 0.9 % IV SOLN
INTRAVENOUS | Status: DC
  Administered 2015-03-19: 1000 mL via INTRAVENOUS

## 2015-03-19 MED ORDER — MEPERIDINE HCL 100 MG/ML IJ SOLN
INTRAMUSCULAR | Status: AC
  Filled 2015-03-19: qty 2

## 2015-03-19 MED ORDER — STERILE WATER FOR IRRIGATION IR SOLN
Status: DC | PRN
  Administered 2015-03-19: 10:00:00

## 2015-03-19 MED ORDER — PROMETHAZINE HCL 25 MG/ML IJ SOLN
INTRAMUSCULAR | Status: AC
  Filled 2015-03-19: qty 1

## 2015-03-19 NOTE — Discharge Instructions (Signed)
You have A HIATAL HERNIA AND gastritis. I biopsied your stomach AND SMALL BOWEL.   DRINK WATER TO KEEP YOUR URINE LIGHT YELLOW.  CONTINUE YOUR WEIGHT LOSS EFFORTS.  FOLLOW A LOW FAT DIET. SEE INFO BELOW.  AVOID TRIGGERS FOR REFLUX.   CONTINUE DEXILANT.  YOUR BIOPSY RESULTS WILL BE AVAILABLE IN MY CHART FEB 14 AND MY OFFICE WILL CONTACT YOU IN 10-14 DAYS WITH YOUR RESULTS.   FOLLOW UP IN 4 MOS.  UPPER ENDOSCOPY AFTER CARE Read the instructions outlined below and refer to this sheet in the next week. These discharge instructions provide you with general information on caring for yourself after you leave the hospital. While your treatment has been planned according to the most current medical practices available, unavoidable complications occasionally occur. If you have any problems or questions after discharge, call DR. Filipe Greathouse, [phone].  ACTIVITY  You may resume your regular activity, but move at a slower pace for the next 24 hours.   Take frequent rest periods for the next 24 hours.   Walking will help get rid of the air and reduce the bloated feeling in your belly (abdomen).   No driving for 24 hours (because of the medicine (anesthesia) used during the test).   You may shower.   Do not sign any important legal documents or operate any machinery for 24 hours (because of the anesthesia used during the test).    NUTRITION  Drink plenty of fluids.   You may resume your normal diet as instructed by your doctor.   Begin with a light meal and progress to your normal diet. Heavy or fried foods are harder to digest and may make you feel sick to your stomach (nauseated).   Avoid alcoholic beverages for 24 hours or as instructed.    MEDICATIONS  You may resume your normal medications.   WHAT YOU CAN EXPECT TODAY  Some feelings of bloating in the abdomen.   Passage of more gas than usual.    IF YOU HAD A BIOPSY TAKEN DURING THE UPPER ENDOSCOPY:  Eat a soft diet  IF YOU HAVE NAUSEA, BLOATING, ABDOMINAL PAIN, OR VOMITING.    FINDING OUT THE RESULTS OF YOUR TEST Not all test results are available during your visit. DR. Oneida Alar WILL CALL YOU WITHIN 14 DAYS OF YOUR PROCEDUE WITH YOUR RESULTS. Do not assume everything is normal if you have not heard from DR. Apolonio Cutting, CALL HER OFFICE AT [phone].  SEEK IMMEDIATE MEDICAL ATTENTION AND CALL THE OFFICE: [phone] IF:  You have more than a spotting of blood in your stool.   Your belly is swollen (abdominal distention).   You are nauseated or vomiting.   You have a temperature over 101F.   You have abdominal pain or discomfort that is severe or gets worse throughout the day.   Gastritis  Gastritis is an inflammation (the body's way of reacting to injury and/or infection) of the stomach. It is often caused by viral or bacterial (germ) infections. It can also be caused BY ASPIRIN, BC/GOODY POWDER'S, (IBUPROFEN) MOTRIN, OR ALEVE (NAPROXEN), chemicals (including alcohol), SPICY FOODS, and medications. This illness may be associated with generalized malaise (feeling tired, not well), UPPER ABDOMINAL STOMACH cramps, and fever. One common bacterial cause of gastritis is an organism known as H. Pylori. This can be treated with antibiotics.    Low-Fat Diet  BREADS, CEREALS, PASTA, RICE, DRIED PEAS, AND BEANS These products are high in carbohydrates and most are low in fat. Therefore, they can be  increased in the diet as substitutes for fatty foods. They too, however, contain calories and should not be eaten in excess. Cereals can be eaten for snacks as well as for breakfast.  Include foods that contain fiber (fruits, vegetables, whole grains, and legumes). Research shows that fiber may lower blood cholesterol levels, especially the water-soluble fiber found in fruits, vegetables, oat products, and legumes.  FRUITS AND VEGETABLES It is good to eat fruits and vegetables. Besides being sources of fiber, both are  rich in vitamins and some minerals. They help you get the daily allowances of these nutrients. Fruits and vegetables can be used for snacks and desserts.  MEATS Limit lean meat, chicken, [place], and fish to no more than 6 ounces per day.  Beef, Pork, and Lamb Use lean cuts of beef, pork, and lamb. Lean cuts include:  Extra-lean ground beef.  Arm roast.  Sirloin tip.  Center-cut ham.  Round steak.  Loin chops.  Rump roast.  Tenderloin.  Trim all fat off the outside of meats before cooking. It is not necessary to severely decrease the intake of red meat, but lean choices should be made. Lean meat is rich in protein and contains a highly absorbable form of iron. Premenopausal women, in particular, should avoid reducing lean red meat because this could increase the risk for low red blood cells (iron-deficiency anemia).  Chicken and [place] These are good sources of protein. The fat of poultry can be reduced by removing the skin and underlying fat layers before cooking. Chicken and [place] can be substituted for lean red meat in the diet. Poultry should not be fried or covered with high-fat sauces.  Fish and Shellfish Fish is a good source of protein. Shellfish contain cholesterol, but they usually are low in saturated fatty acids. The preparation of fish is important. Like chicken and [place], they should not be fried or covered with high-fat sauces.  EGGS Egg whites contain no fat or cholesterol. They can be eaten often. Try 1 to 2 egg whites instead of whole eggs in recipes or use egg substitutes that do not contain yolk.  MILK AND DAIRY PRODUCTS Use skim or 1% milk instead of 2% or whole milk. Decrease whole milk, natural, and processed cheeses. Use nonfat or low-fat (2%) cottage cheese or low-fat cheeses made from vegetable oils. Choose nonfat or low-fat (1 to 2%) yogurt. Experiment with evaporated skim milk in recipes that call for heavy cream. Substitute low-fat yogurt or low-fat cottage  cheese for sour cream in dips and salad dressings. Have at least 2 servings of low-fat dairy products, such as 2 glasses of skim (or 1%) milk each day to help get your daily calcium intake.  FATS AND OILS Reduce the total intake of fats, especially saturated fat. Butterfat, lard, and beef fats are high in saturated fat and cholesterol. These should be avoided as much as possible. Vegetable fats do not contain cholesterol, but certain vegetable fats, such as coconut oil, palm oil, and palm kernel oil are very high in saturated fats. These should be limited. These fats are often used in bakery goods, processed foods, popcorn, oils, and nondairy creamers. Vegetable shortenings and some peanut butters contain hydrogenated oils, which are also saturated fats. Read the labels on these foods and check for saturated vegetable oils.  Unsaturated vegetable oils and fats do not raise blood cholesterol. However, they should be limited because they are fats and are high in calories. Total fat should still be limited to 30% of  your daily caloric intake. Desirable liquid vegetable oils are corn oil, cottonseed oil, olive oil, canola oil, safflower oil, soybean oil, and sunflower oil. Peanut oil is not as good, but small amounts are acceptable. Buy a heart-healthy tub margarine that has no partially hydrogenated oils in the ingredients. Mayonnaise and salad dressings often are made from unsaturated fats, but they should also be limited because of their high calorie and fat content. Seeds, nuts, peanut butter, olives, and avocados are high in fat, but the fat is mainly the unsaturated type. These foods should be limited mainly to avoid excess calories and fat.  OTHER EATING TIPS Snacks  Most sweets should be limited as snacks. They tend to be rich in calories and fats, and their caloric content outweighs their nutritional value. Some good choices in snacks are graham crackers, melba toast, soda crackers, bagels (no egg),  English muffins, fruits, and vegetables. These snacks are preferable to snack crackers, [place] fries, and chips. Popcorn should be air-popped or cooked in small amounts of liquid vegetable oil.  Desserts Eat fruit, low-fat yogurt, and fruit ices instead of pastries, cake, and cookies. Sherbet, angel food cake, gelatin dessert, frozen low-fat yogurt, or other frozen products that do not contain saturated fat (pure fruit juice bars, frozen ice pops) are also acceptable.   COOKING METHODS Choose those methods that use little or no fat. They include: Poaching.  Braising.  Steaming.  Grilling.  Baking.  Stir-frying.  Broiling.  Microwaving.  Foods can be cooked in a nonstick pan without added fat, or use a nonfat cooking spray in regular cookware. Limit fried foods and avoid frying in saturated fat. Add moisture to lean meats by using water, broth, cooking wines, and other nonfat or low-fat sauces along with the cooking methods mentioned above. Soups and stews should be chilled after cooking. The fat that forms on top after a few hours in the refrigerator should be skimmed off. When preparing meals, avoid using excess salt. Salt can contribute to raising blood pressure in some people.  EATING AWAY FROM HOME Order entres, potatoes, and vegetables without sauces or butter. When meat exceeds the size of a deck of cards (3 to 4 ounces), the rest can be taken home for another meal. Choose vegetable or fruit salads and ask for low-calorie salad dressings to be served on the side. Use dressings sparingly. Limit high-fat toppings, such as bacon, crumbled eggs, cheese, sunflower seeds, and olives. Ask for heart-healthy tub margarine instead of butter.

## 2015-03-19 NOTE — Op Note (Signed)
[facility] [address] [place], [address]   ENDOSCOPY PROCEDURE REPORT  PATIENT: Cassandra Goodman, Cassandra Goodman  MR#: [id number] BIRTHDATE: 10/01/1965 , 23  yrs. old GENDER: female  ENDOSCOPIST: Danie Binder, MD REFERRED OX:[id number] Dennard Schaumann, M.D.  PROCEDURE DATE: 03-30-15 PROCEDURE:   EGD w/ biopsy  INDICATIONS:dyspepsia.   heartburn. MEDICATIONS: Promethazine (Phenergan) 12.5 mg IV, Demerol 100 mg IV, and Versed 5 mg IV TOPICAL ANESTHETIC:   Viscous Xylocaine ASA CLASS:  DESCRIPTION OF PROCEDURE:     Physical exam was performed.  Informed consent was obtained from the patient after explaining the benefits, risks, and alternatives to the procedure.  The patient was connected to the monitor and placed in the left lateral position.  Continuous oxygen was provided by nasal cannula and IV medicine administered through an indwelling cannula.  After administration of sedation, the patients esophagus was intubated and the [id number])  endoscope was advanced under direct visualization to the second portion of the duodenum.  The scope was removed slowly by carefully examining the color, texture, anatomy, and integrity of the mucosa on the way out.  The patient was recovered in endoscopy and discharged home in satisfactory condition.  Estimated blood loss is zero unless otherwise noted in this procedure report.    ESOPHAGUS: The mucosa of the esophagus appeared normal.   STOMACH: A large hiatal hernia was noted.   Mild non-erosive gastritis (inflammation) was found in the gastric antrum.  Multiple biopsies were performed using cold forceps.   DUODENUM: The duodenal mucosa showed no abnormalities in the bulb and 2nd part of the duodenum. Cold forceps biopsies were taken in the bulb and second portion.  COMPLICATIONS: There were no immediate complications.  ENDOSCOPIC IMPRESSION: 1.   DYSPEPSIA DUE TO GERD/GASTRITIS 2.   Large hiatal  hernia  RECOMMENDATIONS: DRINK WATER TO KEEP URINE LIGHT YELLOW. CONTINUE WEIGHT LOSS EFFORTS. FOLLOW A LOW FAT DIET. AVOID TRIGGERS FOR REFLUX. CONTINUE DEXILANT. AWAIT BIOPSY RESULTS. FOLLOW UP IN 4 MOS.  REPEAT EXAM: eSigned:  Danie Binder, MD 03-30-15 11:48 AM  CPT CODES: ICD CODES:  The ICD and CPT codes recommended by this software are interpretations from the data that the clinical staff has captured with the software.  The verification of the translation of this report to the ICD and CPT codes and modifiers is the sole responsibility of the health care institution and practicing physician where this report was generated.  [employer] will not be held responsible for the validity of the ICD and CPT codes included on this report.  AMA assumes no liability for data contained or not contained herein. CPT is a Designer, television/film set of the [employer].

## 2015-03-19 NOTE — H&P (View-Only) (Signed)
Subjective:    Patient ID: Cassandra Goodman, female    DOB: [birth year], 50 y.o.   MRN: [id number]  [place], MD  HPI Had onset dyspepsia.  HEARTBURN: BURNING CHEST PAIN/TROUBLE SWALLOWING WAS OUT OF CONTROL SINCE OCT. TOOK ABX FOR 2 WEEKS. FELT BETTER AFTER ABX & [place]. FEELS GASSY, BLOATED, AND NAUSEATED. NAUSEA COMES AND GOES. 3-4 TIMES/WEEK. LASTS HRS IF AT WORK. WORKS AT [employer]. BMs: SOME DAYS BETTER THAN OTHERS(#4-5). RARE UPPER ABDOMINAL OFF AND ON. BETTER WITH DEXILANT AND TIME. STOPPED SMOKING FOR 7 MOS. SMOKING: 1.5 PK/DAY-BURNS A LOT. HAS CHANTIX.  PT DENIES FEVER, CHILLS, HEMATOCHEZIA, melena, diarrhea, CHEST PAIN, SHORTNESS OF BREATH, CHANGE IN BOWEL IN HABITS, constipation, abdominal pain, problems with sedation, OR heartburn or indigestion. WEIGHT LOSS: NO. APPETITE: GOOD   Past Medical History  Diagnosis Date  . Hypertension   . Hyperlipidemia   . Allergy     SEASONAL  . Depression   . Smoker    Past Surgical History  Procedure Laterality Date  . Laparoscopic tubal ligation    . Tubal ligation     Allergies  Allergen Reactions  . Morphine And Related Shortness Of Breath and Itching    Sweating   Current Outpatient Prescriptions  Medication Sig Dispense Refill  . ALPRAZolam (XANAX) 1 MG tablet TAKE ONE PO QHS PRN FOR ANXIETY OR SLEEP    . atorvastatin (LIPITOR) 40 MG tablet TAKE ONE TABLET BY MOUTH ONCE DAILY    . citalopram (CELEXA) 20 MG tablet TAKE ONE TABLET BY MOUTH ONCE DAILY    . dexlansoprazole (DEXILANT) 60 MG capsule Take 1 capsule (60 mg total) by mouth daily.    Marland Kitchen PRINZIDE,ZESTORETIC) 20-25 MG tablet TAKE ONE TABLET BY MOUTH ONCE DAILY    . aspirin 81 MG tablet Take 81 mg by mouth daily. Reported on 02/25/2015    . Multiple Vitamin Take 1 tablet by mouth daily. Reported on 02/25/2015     Family History  Problem Relation Age of Onset  . Depression Mother   . Diabetes Mother   . Cancer Mother     ovarian  . Depression Maternal  Grandmother   . Diabetes Maternal Grandmother   . Diabetes Father   . COPD Maternal Grandfather     liver, heavy alcohol  . Cancer Maternal Aunt     breast  . Cancer Maternal Uncle     throat, smoker    Social History   Social History  . Marital Status: Married    Spouse Name: Cassandra Goodman  . Number of Children: 2  . Years of Education: 12   Occupational History  . unemployed    Social History Main Topics  . Smoking status: Current Every Day Smoker -- 1.50 packs/day for 20 years    Types: Cigarettes  . Smokeless tobacco: Former Systems developer    Quit date: 06/11/2014  . Alcohol Use: 0.0 oz/week    0 Standard drinks or equivalent per week     Comment: OCCASIONALLY  . Drug Use: No  . Sexual Activity: No   Review of Systems PER HPI OTHERWISE ALL SYSTEMS ARE NEGATIVE.    Objective:   Physical Exam  Constitutional: She is oriented to person, place, and time. She appears well-developed and well-nourished. No distress.  HENT:  Head: Normocephalic and atraumatic.  Mouth/Throat: Oropharynx is clear and moist. No oropharyngeal exudate.  Eyes: Pupils are equal, round, and reactive to light. No scleral icterus.  Neck: Normal range of motion. Neck supple.  Cardiovascular: Normal rate, regular rhythm and normal heart sounds.   Pulmonary/Chest: Effort normal and breath sounds normal. No respiratory distress.  Abdominal: Soft. Bowel sounds are normal. She exhibits no distension. There is tenderness. There is no rebound and no guarding.  MILD RLQ TTP  Musculoskeletal: She exhibits no edema.  BRACE ON R ANKLE  Lymphadenopathy:    She has no cervical adenopathy.  Neurological: She is alert and oriented to person, place, and time.  NO FOCAL DEFICITS  Psychiatric: She has a normal mood and affect.  Vitals reviewed.     Assessment & Plan:

## 2015-03-19 NOTE — Interval H&P Note (Signed)
History and Physical Interval Note:  03/19/2015 9:41 AM  Cassandra Goodman  has presented today for surgery, with the diagnosis of DYSPEPSIA  The various methods of treatment have been discussed with the patient and family. After consideration of risks, benefits and other options for treatment, the patient has consented to  Procedure(s) with comments: ESOPHAGOGASTRODUODENOSCOPY (EGD) (N/A) - 945 as a surgical intervention .  The patient's history has been reviewed, patient examined, no change in status, stable for surgery.  I have reviewed the patient's chart and labs.  Questions were answered to the patient's satisfaction.     [employer]

## 2015-03-23 ENCOUNTER — Encounter (HOSPITAL_COMMUNITY): Payer: Self-pay | Admitting: Gastroenterology

## 2015-03-23 ENCOUNTER — Ambulatory Visit
Admission: RE | Admit: 2015-03-23 | Discharge: 2015-03-23 | Disposition: A | Discharge status: Home / Self Care | Payer: Worker's Compensation | Source: Ambulatory Visit | Attending: Orthopaedic Surgery | Admitting: Orthopaedic Surgery

## 2015-03-23 DIAGNOSIS — M79671 Pain in right foot: Secondary | ICD-10-CM

## 2015-03-27 ENCOUNTER — Other Ambulatory Visit: Payer: Self-pay | Admitting: Family Medicine

## 2015-03-29 ENCOUNTER — Telehealth: Payer: Self-pay | Admitting: Family Medicine

## 2015-03-29 NOTE — Telephone Encounter (Signed)
Requesting a refill on Xanax - last refill 02/25/15 - ? OK to Refill

## 2015-03-29 NOTE — Telephone Encounter (Signed)
ok 

## 2015-03-29 NOTE — Telephone Encounter (Signed)
LRF 02/25/15 #30  LOV 12/28/14  OK refill?

## 2015-03-30 MED ORDER — ALPRAZOLAM 1 MG PO TABS: ORAL_TABLET | ORAL | Status: DC

## 2015-03-30 NOTE — Telephone Encounter (Signed)
Medication called/sent to requested pharmacy  

## 2015-03-30 NOTE — Telephone Encounter (Signed)
rx called in

## 2015-03-31 ENCOUNTER — Telehealth: Payer: Self-pay | Admitting: Gastroenterology

## 2015-03-31 NOTE — Telephone Encounter (Signed)
Please call pt. HER stomach Bx shows mild gastritis.  HER SMALL BOWEL BIOPSIES ARE NORMAL.   CONTINUE YOUR WEIGHT LOSS EFFORTS. LOSE 10 POUNDS. DRINK WATER TO KEEP YOUR URINE LIGHT YELLOW. FOLLOW A LOW FAT DIET. MEATS SHOULD BE BAKED, BROILED, OR BOILED.  AVOID FRIED FOODS. CONTINUE DEXILANT. FOLLOW UP IN 6 MOS.

## 2015-03-31 NOTE — Telephone Encounter (Signed)
Reminder in epic °

## 2015-04-01 NOTE — Telephone Encounter (Signed)
LM for pt to call

## 2015-04-02 NOTE — Telephone Encounter (Signed)
Printed the results and mailed to pt.

## 2015-04-05 ENCOUNTER — Ambulatory Visit (INDEPENDENT_AMBULATORY_CARE_PROVIDER_SITE_OTHER): Payer: 59 | Admitting: Family Medicine

## 2015-04-05 ENCOUNTER — Encounter: Payer: Self-pay | Admitting: Family Medicine

## 2015-04-05 VITALS — BP 118/76 | HR 94 | Temp 98.3°F | Resp 16 | Ht 64.0 in | Wt 232.0 lb

## 2015-04-05 DIAGNOSIS — E785 Hyperlipidemia, unspecified: Secondary | ICD-10-CM | POA: Diagnosis not present

## 2015-04-05 DIAGNOSIS — Z0000 Encounter for general adult medical examination without abnormal findings: Secondary | ICD-10-CM | POA: Diagnosis not present

## 2015-04-05 NOTE — Progress Notes (Signed)
Subjective:    Patient ID: Cassandra Goodman, female    DOB: [birth year], 50 y.o.   MRN: [id number]  HPI  She is here today for complete physical exam.  Over the last 2 weeks she has had crampy lower pelvic pain. It comes and goes without reason. Feels like menstrual cramps. She has not had a menstrual cycle in the last 5 years ever since she had an endometrial ablation. However Sunday she experienced brown dried bloody discharge and afterwards the menstrual cramps improved.  She is still mildly tender to palpation in the lower pelvic area. Last mammogram was in September and was normal. She has had an EGD which revealed gastritis and a hiatal hernia. She's never had a colonoscopy. She is due for a Pap smear. Past Medical History  Diagnosis Date  . Hypertension   . Hyperlipidemia   . Allergy     SEASONAL  . Depression   . Smoker    Past Surgical History  Procedure Laterality Date  . Laparoscopic tubal ligation    . Tubal ligation    . Ganglion cyst excision Right     foot  . Esophagogastroduodenoscopy N/A 03/19/2015    Procedure: ESOPHAGOGASTRODUODENOSCOPY (EGD);  Surgeon: Danie Binder, MD;  Location: [facility];  Service: Endoscopy;  Laterality: N/A;  945   Current Outpatient Prescriptions on File Prior to Visit  Medication Sig Dispense Refill  . ALPRAZolam (XANAX) 1 MG tablet TAKE ONE TABLET BY MOUTH AT BEDTIME AS NEEDED FOR ANXIETY OR SLEEP 30 tablet 2  . atorvastatin (LIPITOR) 40 MG tablet TAKE ONE TABLET BY MOUTH ONCE DAILY 30 tablet 3  . citalopram (CELEXA) 20 MG tablet TAKE ONE TABLET BY MOUTH ONCE DAILY 30 tablet 11  . dexlansoprazole (DEXILANT) 60 MG capsule Take 1 capsule (60 mg total) by mouth daily. 30 capsule 5  . lisinopril-hydrochlorothiazide (PRINZIDE,ZESTORETIC) 20-25 MG tablet TAKE ONE TABLET BY MOUTH ONCE DAILY 30 tablet 11  . traMADol (ULTRAM) 50 MG tablet Take 50 mg by mouth every 6 (six) hours as needed. pain     No current facility-administered medications on  file prior to visit.   Allergies  Allergen Reactions  . Morphine And Related Shortness Of Breath and Itching    Sweating   Social History   Social History  . Marital Status: Married    Spouse Name: Karianna Pacha  . Number of Children: 2  . Years of Education: 12   Occupational History  . unemployed    Social History Main Topics  . Smoking status: Current Every Day Smoker -- 1.50 packs/day for 20 years    Types: Cigarettes  . Smokeless tobacco: Former Systems developer    Quit date: 06/11/2014  . Alcohol Use: 0.0 oz/week    0 Standard drinks or equivalent per week     Comment: OCCASIONALLY  . Drug Use: No  . Sexual Activity: No   Other Topics Concern  . Not on file   Social History Narrative   Family History  Problem Relation Age of Onset  . Depression Mother   . Diabetes Mother   . Cancer Mother     ovarian  . Depression Maternal Grandmother   . Diabetes Maternal Grandmother   . Diabetes Father   . COPD Maternal Grandfather     liver, heavy alcohol  . Cancer Maternal Aunt     breast  . Cancer Maternal Uncle     throat, smoker      Review of Systems  All other systems reviewed and are negative.      Objective:   Physical Exam  Constitutional: She is oriented to person, place, and time. She appears well-developed and well-nourished. No distress.  HENT:  Head: Normocephalic and atraumatic.  Right Ear: External ear normal.  Left Ear: External ear normal.  Nose: Nose normal.  Mouth/Throat: Oropharynx is clear and moist. No oropharyngeal exudate.  Eyes: Conjunctivae and EOM are normal. Pupils are equal, round, and reactive to light. Right eye exhibits no discharge. Left eye exhibits no discharge. No scleral icterus.  Neck: Normal range of motion. Neck supple. No JVD present. No tracheal deviation present. No thyromegaly present.  Cardiovascular: Normal rate, regular rhythm, normal heart sounds and intact distal pulses.  Exam reveals no gallop and no friction rub.   No  murmur heard. Pulmonary/Chest: Effort normal and breath sounds normal. No stridor. No respiratory distress. She has no wheezes. She has no rales. She exhibits no tenderness.  Abdominal: Soft. Bowel sounds are normal. She exhibits no distension and no mass. There is no tenderness. There is no rebound and no guarding. Hernia confirmed negative in the right inguinal area and confirmed negative in the left inguinal area.  Genitourinary: There is no rash, tenderness or lesion on the right labia. There is no rash, tenderness or lesion on the left labia. Uterus is tender. Cervix exhibits discharge. Cervix exhibits no motion tenderness and no friability. Right adnexum displays no mass, no tenderness and no fullness. Left adnexum displays no mass, no tenderness and no fullness. No vaginal discharge found.  Musculoskeletal: Normal range of motion. She exhibits no edema or tenderness.  Lymphadenopathy:    She has no cervical adenopathy.       Right: No inguinal adenopathy present.       Left: No inguinal adenopathy present.  Neurological: She is alert and oriented to person, place, and time. She has normal reflexes. She displays normal reflexes. No cranial nerve deficit. She exhibits normal muscle tone. Coordination normal.  Skin: Skin is warm. No rash noted. She is not diaphoretic. No erythema. No pallor.  Psychiatric: She has a normal mood and affect. Her behavior is normal. Judgment and thought content normal.  Vitals reviewed.  There is a trace bloody discharge coming from the cervical os       Assessment & Plan:  Routine general medical examination at a health care facility - Plan: CBC with Differential/Platelet, COMPLETE METABOLIC PANEL WITH GFR, Lipid panel, PAP, Thin Prep w/HPV rflx HPV Type 16/18  HLD (hyperlipidemia) - Plan: CBC with Differential/Platelet, COMPLETE METABOLIC PANEL WITH GFR, Lipid panel  Physical exam today is normal except for obesity. I recommended diet exercise and weight  loss. Blood pressure is excellent. I will check a fasting lipid panel along with a CBC. Mammogram is up-to-date. Pap smear sent to pathology and labeled container. I recommended smoking cessation. I believe the crampy pelvic pain she was having is likely related to her menstrual cycle. I believe she has had her period and it was abnormal in appearance given the ablation. If this persists, I would recommend a pelvic ultrasound and possibly an endometrial biopsy as her body habitus makes it difficult to fully exclude any uterine masses.

## 2015-04-06 ENCOUNTER — Encounter: Payer: Self-pay | Admitting: Family Medicine

## 2015-04-06 LAB — CBC WITH DIFFERENTIAL/PLATELET
BASOS PCT: 0 % (ref 0–1)
Basophils Absolute: 0 10*3/uL (ref 0.0–0.1)
Eosinophils Absolute: 0.1 10*3/uL (ref 0.0–0.7)
Eosinophils Relative: 1 % (ref 0–5)
HEMATOCRIT: 43.6 % (ref 36.0–46.0)
HEMOGLOBIN: 14.2 g/dL (ref 12.0–15.0)
LYMPHS ABS: 2.2 10*3/uL (ref 0.7–4.0)
LYMPHS PCT: 32 % (ref 12–46)
MCH: 28.9 pg (ref 26.0–34.0)
MCHC: 32.6 g/dL (ref 30.0–36.0)
MCV: 88.6 fL (ref 78.0–100.0)
MONOS PCT: 6 % (ref 3–12)
MPV: 10.7 fL (ref 8.6–12.4)
Monocytes Absolute: 0.4 10*3/uL (ref 0.1–1.0)
NEUTROS ABS: 4.2 10*3/uL (ref 1.7–7.7)
NEUTROS PCT: 61 % (ref 43–77)
Platelets: 265 10*3/uL (ref 150–400)
RBC: 4.92 MIL/uL (ref 3.87–5.11)
RDW: 14.6 % (ref 11.5–15.5)
WBC: 6.9 10*3/uL (ref 4.0–10.5)

## 2015-04-06 LAB — COMPLETE METABOLIC PANEL WITH GFR
ALBUMIN: 4.1 g/dL (ref 3.6–5.1)
ALK PHOS: 99 U/L (ref 33–115)
ALT: 31 U/L — ABNORMAL HIGH (ref 6–29)
AST: 28 U/L (ref 10–35)
BILIRUBIN TOTAL: 0.6 mg/dL (ref 0.2–1.2)
BUN: 10 mg/dL (ref 7–25)
CO2: 28 mmol/L (ref 20–31)
Calcium: 10.1 mg/dL (ref 8.6–10.2)
Chloride: 103 mmol/L (ref 98–110)
Creat: 0.65 mg/dL (ref 0.50–1.10)
GFR, Est African American: >89 mL/min (ref >=60)
GLUCOSE: 114 mg/dL — AB (ref 70–99)
Potassium: 3.8 mmol/L (ref 3.5–5.3)
SODIUM: 139 mmol/L (ref 135–146)
TOTAL PROTEIN: 6.9 g/dL (ref 6.1–8.1)

## 2015-04-06 LAB — LIPID PANEL
Cholesterol: 145 mg/dL (ref 125–200)
HDL: 36 mg/dL — ABNORMAL LOW (ref >=46)
LDL Cholesterol: 70 mg/dL (ref <130)
Total CHOL/HDL Ratio: 4 Ratio (ref <=5.0)
Triglycerides: 196 mg/dL — ABNORMAL HIGH (ref <150)
VLDL: 39 mg/dL — ABNORMAL HIGH (ref <30)

## 2015-04-07 ENCOUNTER — Encounter: Payer: Self-pay | Admitting: Family Medicine

## 2015-04-07 LAB — PAP, THIN PREP W/HPV RFLX HPV TYPE 16/18: HPV DNA HIGH RISK: NOT DETECTED

## 2015-06-21 ENCOUNTER — Other Ambulatory Visit: Payer: Self-pay | Admitting: Family Medicine

## 2015-06-21 NOTE — Telephone Encounter (Signed)
Refill appropriate and filled per protocol. 

## 2015-07-15 ENCOUNTER — Encounter: Payer: Self-pay | Admitting: Gastroenterology

## 2015-07-25 ENCOUNTER — Other Ambulatory Visit: Payer: Self-pay | Admitting: Family Medicine

## 2015-07-27 NOTE — Telephone Encounter (Signed)
Refill appropriate and filled per protocol. 

## 2015-08-24 ENCOUNTER — Other Ambulatory Visit: Payer: Self-pay | Admitting: Family Medicine

## 2015-08-24 NOTE — Telephone Encounter (Signed)
Ok to refill 

## 2015-08-24 NOTE — Telephone Encounter (Signed)
ok 

## 2015-10-13 ENCOUNTER — Other Ambulatory Visit: Payer: Self-pay | Admitting: Family Medicine

## 2015-12-22 ENCOUNTER — Other Ambulatory Visit: Payer: Self-pay | Admitting: Family Medicine

## 2015-12-22 NOTE — Telephone Encounter (Signed)
Ok to refill??  Last office visit 04/05/2015.  Last refill 09/25/2015, #2 refills.

## 2015-12-22 NOTE — Telephone Encounter (Signed)
okay

## 2015-12-23 ENCOUNTER — Other Ambulatory Visit: Payer: Self-pay | Admitting: Family Medicine

## 2015-12-29 ENCOUNTER — Ambulatory Visit: Payer: 59 | Admitting: Family Medicine

## 2016-01-06 ENCOUNTER — Ambulatory Visit (INDEPENDENT_AMBULATORY_CARE_PROVIDER_SITE_OTHER): Payer: 59 | Admitting: Orthopaedic Surgery

## 2016-01-06 ENCOUNTER — Encounter (INDEPENDENT_AMBULATORY_CARE_PROVIDER_SITE_OTHER): Payer: Self-pay | Admitting: Orthopaedic Surgery

## 2016-01-06 VITALS — BP 145/95 | HR 86 | Ht 64.0 in | Wt 210.0 lb

## 2016-01-06 DIAGNOSIS — M67471 Ganglion, right ankle and foot: Secondary | ICD-10-CM | POA: Insufficient documentation

## 2016-01-06 DIAGNOSIS — M545 Low back pain: Secondary | ICD-10-CM

## 2016-01-06 MED ORDER — GABAPENTIN 100 MG PO CAPS: 100.0000 mg | ORAL_CAPSULE | Freq: Every day | ORAL | 1 refills | Status: DC

## 2016-01-06 NOTE — Progress Notes (Signed)
Office Visit Note   Patient: Cassandra Goodman           Date of Birth: 1965-07-12           MRN: [id number] Visit Date: 01/06/2016              Requested by: Susy Frizzle, MD [address] [facility] [place], [place] [address] PCP: Odette Fraction, MD   Assessment & Plan: Visit Diagnoses:  1. Ganglion cyst of right foot   2. Acute right-sided low back pain, with sciatica presence unspecified     Plan: We'll start her on some Neurontin 100 mg 1 at night for a week after that she can increase it to 2 tablets at night. I plan on rechecking her in about 2 months. I discussed with her again that reoperation and cyst removal is an option but she still would be at risk for recurrence of the cyst which she understands. She is continuing her other medications including blood pressure medication and also Celexa.  Follow-Up Instructions: Return in about 1 month (around 02/05/2016).   Orders:  No orders of the defined types were placed in this encounter.  Meds ordered this encounter  Medications  . gabapentin (NEURONTIN) 100 MG capsule    Sig: Take 1 capsule (100 mg total) by mouth at bedtime.    Dispense:  60 capsule    Refill:  1      Procedures: No procedures performed   Clinical Data: No additional findings.   Subjective: Chief Complaint  Patient presents with  . Right Foot - Pain    Patient returns with right foot pain and recurrent right lateral foot ganglion.  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 She did go for a second opinion per last office note. She saw Dr. Berenice Primas at [facility] on 11/23/2015.   Patient is here second opinion from Dr. Berenice Primas is available and this was reviewed with patient. Patient had on-the-job injury had a ganglion of the lateral aspect of her foot involving area around the peroneal tendon and calcaneal cuboid joint which was excised. Postoperatively she had gradual recurrence of the ganglion has had the  repeat MRI which shows recurrence of the multilobulated ganglion. She's had persistent pain problems in this area and nerve block was performed of the sural nerve which did not give her any pain relief. Her husband passed away from this time. She's had problems with depression and states particularly around the holidays it makes it very difficult. She is back working and states that she had a new injury to her back and was sent and had an MRI scan which shows a bulging disc on the right side which corresponds with her pain that radiates down her leg to the lateral side of her foot. I do not have the disc available to review. Patient was doing a lot of sitting at work but states when she sits on a hard chair which is not allowed to have a cushions  since she says it might possibly  shift. She's not doing most of her work on her feet.  Review of Systems  Constitutional: Negative for chills and diaphoresis.  HENT: Negative for ear discharge, ear pain and nosebleeds.   Eyes: Negative for discharge and visual disturbance.  Respiratory: Negative for cough, choking and shortness of breath.   Cardiovascular: Negative for chest pain and palpitations.  Gastrointestinal: Negative for abdominal distention and abdominal pain.  Endocrine: Negative  for cold intolerance and heat intolerance.  Genitourinary: Negative for flank pain and hematuria.  Skin: Negative for rash and wound.  Neurological: Negative for seizures and speech difficulty.  Hematological: Negative for adenopathy. Does not bruise/bleed easily.  Psychiatric/Behavioral: Negative for agitation and suicidal ideas.       Positive for depression. Recent death of her husband who suddenly expired shortly after admission in hospital     Objective: Vital Signs: BP (!) 145/95   Pulse 86   Ht 5\' 4"  (1.626 m)   Wt 210 lb (95.3 kg)   BMI 36.05 kg/m   Physical Exam  Constitutional: She is oriented to person, place, and time. She appears well-developed  and well-nourished.  Patient has lost weight since her last visit is down about 30 pounds in the last 6 months. Affect is flat she is pleasant and conversant.  HENT:  Head: Normocephalic.  Right Ear: External ear normal.  Left Ear: External ear normal.  Eyes: Pupils are equal, round, and reactive to light.  Neck: No tracheal deviation present. No thyromegaly present.  Cardiovascular: Normal rate.   Pulmonary/Chest: Effort normal.  Abdominal: Soft.  Musculoskeletal:  Patient has tenderness over the incision laterally along her foot as well as tenderness at the fibular neck and sciatic notch on the right pain with straight leg raising at 80. Multilobulated ganglion is present appears to be the same size as the area was postoperatively. There remains more prominent dorsally to the incision and less over the plantar surface of the foot. Peroneal strength is strong. Pulses are 2+. She complains of some numbness laterally over the foot as well as plantar surface of the foot. Pain with popliteal compression on the right negative on the left. Normal hip range of motion. No perineal or gastroc or anterior tib atrophy of the right or left leg.  Neurological: She is alert and oriented to person, place, and time.  Skin: Skin is warm and dry.  Psychiatric: She has a normal mood and affect. Her behavior is normal.    Ortho Exam patient statement way pelvis is level. Spinous straight. She ambulates with a short stride gait. Pedal pulses are intact.  Specialty Comments:  No specialty comments available.  Imaging: No results found. Lumbar MRI disc was not available for review she reports that she has a bulging disc on the right side in the lumbar region and is waiting on referral from [employer] for evaluation.  PMFS History: Patient Active Problem List   Diagnosis Date Noted  . Ganglion cyst of right foot 01/06/2016  . Dyspepsia 02/25/2015  . Colon cancer screening 02/25/2015  . Smoker     Past Medical History:  Diagnosis Date  . Allergy    SEASONAL  . Bulging lumbar disc   . Depression   . Hyperlipidemia   . Hypertension   . Prediabetes   . Smoker     Family History  Problem Relation Age of Onset  . Depression Mother   . Diabetes Mother   . Cancer Mother     ovarian  . Depression Maternal Grandmother   . Diabetes Maternal Grandmother   . Diabetes Father   . COPD Maternal Grandfather     liver, heavy alcohol  . Cancer Maternal Aunt     breast  . Cancer Maternal Uncle     throat, smoker    Past Surgical History:  Procedure Laterality Date  . ESOPHAGOGASTRODUODENOSCOPY N/A 03/19/2015   Procedure: ESOPHAGOGASTRODUODENOSCOPY (EGD);  Surgeon: Danie Binder,  MD;  Location: [facility];  Service: Endoscopy;  Laterality: N/A;  945  . GANGLION CYST EXCISION Right    foot  . LAPAROSCOPIC TUBAL LIGATION    . TUBAL LIGATION     Social History   Occupational History  . unemployed    Social History Main Topics  . Smoking status: Current Every Day Smoker    Packs/day: 1.50    Years: 20.00    Types: Cigarettes  . Smokeless tobacco: Former Systems developer    Quit date: 06/11/2014  . Alcohol use 0.0 oz/week     Comment: OCCASIONALLY  . Drug use: No  . Sexual activity: No

## 2016-01-10 ENCOUNTER — Encounter: Payer: Self-pay | Admitting: Family Medicine

## 2016-01-10 ENCOUNTER — Ambulatory Visit (INDEPENDENT_AMBULATORY_CARE_PROVIDER_SITE_OTHER): Payer: 59 | Admitting: Family Medicine

## 2016-01-10 VITALS — BP 148/102 | HR 80 | Temp 98.6°F | Resp 16 | Ht 64.0 in | Wt 216.0 lb

## 2016-01-10 DIAGNOSIS — N2889 Other specified disorders of kidney and ureter: Secondary | ICD-10-CM

## 2016-01-10 LAB — URINALYSIS, ROUTINE W REFLEX MICROSCOPIC
Bilirubin Urine: NEGATIVE
Glucose, UA: NEGATIVE
Ketones, ur: NEGATIVE
LEUKOCYTES UA: NEGATIVE
NITRITE: NEGATIVE
PH: 7 (ref 5.0–8.0)
Protein, ur: NEGATIVE
Specific Gravity, Urine: 1.02 (ref 1.001–1.035)

## 2016-01-10 LAB — URINALYSIS, MICROSCOPIC ONLY
CASTS: NONE SEEN [LPF]
Crystals: NONE SEEN [HPF]
Yeast: NONE SEEN [HPF]

## 2016-01-10 NOTE — Progress Notes (Signed)
Subjective:    Patient ID: Cassandra Goodman, female    DOB: [birth year], 50 y.o.   MRN: [id number]  HPI Patient fell and injured her back. This is currently being evaluated and treated by [facility] [employer] [facility] [employer]. Injury occurred in August. She has failed conservative treatment including physical therapy. MRI was obtained of the lumbar spine in November on November 7. This revealed right lateral and posterior disc protrusion at L5-S1 that was mild. Also showed a small right renal cyst. She was referred here today to discuss the renal cyst. She denies any hematuria or dysuria. She denies any fevers or chills. Urinalysis today reveals: Office Visit on 01/10/2016  Component Date Value Ref Range Status  . Color, Urine 01/10/2016 YELLOW  YELLOW Final  . APPearance 01/10/2016 CLEAR  CLEAR Final  . Specific Gravity, Urine 01/10/2016 1.020  1.001 - 1.035 Final  . pH 01/10/2016 7.0  5.0 - 8.0 Final  . Glucose, UA 01/10/2016 NEGATIVE  NEGATIVE Final  . Bilirubin Urine 01/10/2016 NEGATIVE  NEGATIVE Final  . Ketones, ur 01/10/2016 NEGATIVE  NEGATIVE Final  . Hgb urine dipstick 01/10/2016 TRACE* NEGATIVE Final  . Protein, ur 01/10/2016 NEGATIVE  NEGATIVE Final  . Nitrite 01/10/2016 NEGATIVE  NEGATIVE Final  . Leukocytes, UA 01/10/2016 NEGATIVE  NEGATIVE Final  . WBC, UA 01/10/2016 0-5  <=5 WBC/HPF Final  . RBC / HPF 01/10/2016 0-2  <=2 RBC/HPF Final  . Squamous Epithelial / LPF 01/10/2016 0-5  <=5 HPF Final  . Bacteria, UA 01/10/2016 FEW* NONE SEEN HPF Final  . Crystals 01/10/2016 NONE SEEN  NONE SEEN HPF Final  . Casts 01/10/2016 NONE SEEN  NONE SEEN LPF Final  . Yeast 01/10/2016 NONE SEEN  NONE SEEN HPF Final   Past Medical History:  Diagnosis Date  . Allergy    SEASONAL  . Bulging lumbar disc   . Depression   . Hyperlipidemia   . Hypertension   . Prediabetes   . Smoker    Past Surgical History:  Procedure Laterality Date  . ESOPHAGOGASTRODUODENOSCOPY N/A 03/19/2015   Procedure:  ESOPHAGOGASTRODUODENOSCOPY (EGD);  Surgeon: Danie Binder, MD;  [facility]: [facility];  Service: Endoscopy;  Laterality: N/A;  945  . GANGLION CYST EXCISION Right    foot  . LAPAROSCOPIC TUBAL LIGATION    . TUBAL LIGATION     Current Outpatient Prescriptions on File Prior to Visit  Medication Sig Dispense Refill  . ALPRAZolam (XANAX) 1 MG tablet TAKE ONE TABLET BY MOUTH AT BEDTIME AS NEEDED 30 tablet 2  . atorvastatin (LIPITOR) 40 MG tablet TAKE ONE TABLET BY MOUTH ONCE DAILY 90 tablet 1  . citalopram (CELEXA) 20 MG tablet TAKE ONE TABLET BY MOUTH ONCE DAILY 30 tablet 11  . DEXILANT 60 MG capsule TAKE ONE CAPSULE BY MOUTH ONCE DAILY 30 capsule 11  . gabapentin (NEURONTIN) 100 MG capsule Take 1 capsule (100 mg total) by mouth at bedtime. 60 capsule 1  . lisinopril-hydrochlorothiazide (PRINZIDE,ZESTORETIC) 20-25 MG tablet TAKE ONE TABLET BY MOUTH ONCE DAILY 30 tablet 11  . traMADol (ULTRAM) 50 MG tablet Take 50 mg by mouth every 6 (six) hours as needed. pain     No current facility-administered medications on file prior to visit.    Allergies  Allergen Reactions  . Morphine And Related Shortness Of Breath and Itching    Sweating   Social History   Social History  . Marital status: Married    Spouse name: Avalene Huset  . Number of children: 2  .  Years of education: 76   Occupational History  . unemployed    Social History Main Topics  . Smoking status: Current Every Day Smoker    Packs/day: 1.50    Years: 20.00    Types: Cigarettes  . Smokeless tobacco: Former Systems developer    Quit date: 06/11/2014  . Alcohol use 0.0 oz/week     Comment: OCCASIONALLY  . Drug use: No  . Sexual activity: No   Other Topics Concern  . Not on file   Social History Narrative  . No narrative on file      Review of Systems  All other systems reviewed and are negative.      Objective:   Physical Exam  Cardiovascular: Normal rate, regular rhythm and normal heart sounds.   Pulmonary/Chest:  Effort normal and breath sounds normal.  Abdominal: Soft. Bowel sounds are normal. She exhibits no distension. There is no tenderness. There is no rebound and no guarding.  Musculoskeletal: She exhibits no edema.       Lumbar back: She exhibits decreased range of motion, tenderness and bony tenderness.  Vitals reviewed.         Assessment & Plan:  Renal mass, right - Plan: CBC with Differential/Platelet, Urinalysis, Routine w reflex microscopic (not at [facility]), BASIC METABOLIC PANEL WITH GFR  The renal cyst seen on MRI is a coincidental finding. There are no concerning features documented on MRI. There are no concerning features demonstrated in her urinalysis. I reassured the patient that the lesion on her right kidney needs no further workup. Defer management of her chronic low back pain to the [employer] physician. She is scheduled to see a "back surgeon" per [facility] [employer] [facility] [employer].  Blood pressure is significantly elevated. I have recommended that the patient take her blood pressure twice a day for the next week and report the values to me so that we can determine if she needs additional medication area she is already on Zestoretic 20/25 daily

## 2016-01-12 ENCOUNTER — Telehealth: Payer: Self-pay | Admitting: Family Medicine

## 2016-01-12 NOTE — Telephone Encounter (Signed)
-----   Message from Susy Frizzle, MD sent at 01/10/2016  4:46 PM EST ----- Have patient check BP twice daily and report values to me in 2 weeks.  Too high today.

## 2016-01-12 NOTE — Telephone Encounter (Signed)
LMTRC

## 2016-01-13 ENCOUNTER — Telehealth (INDEPENDENT_AMBULATORY_CARE_PROVIDER_SITE_OTHER): Payer: Self-pay | Admitting: Orthopaedic Surgery

## 2016-01-13 NOTE — Telephone Encounter (Signed)
Can you fax these notes to the nurse case manager, please? Thanks.

## 2016-01-13 NOTE — Telephone Encounter (Signed)
Cathleen Fears, nurse case manager, called to retreive pt visit notes... He stated his fax number was [phone]... Gerald Stabs phone number is [phone]

## 2016-01-14 NOTE — Telephone Encounter (Signed)
LMTRC

## 2016-01-20 NOTE — Telephone Encounter (Signed)
LMTRC

## 2016-01-21 NOTE — Telephone Encounter (Signed)
Pt will check BP and call back with readings

## 2016-02-03 ENCOUNTER — Encounter (INDEPENDENT_AMBULATORY_CARE_PROVIDER_SITE_OTHER): Payer: Self-pay | Admitting: Orthopaedic Surgery

## 2016-02-03 ENCOUNTER — Ambulatory Visit (INDEPENDENT_AMBULATORY_CARE_PROVIDER_SITE_OTHER): Payer: 59 | Admitting: Orthopaedic Surgery

## 2016-02-03 VITALS — BP 137/104 | HR 81 | Ht 67.0 in | Wt 210.0 lb

## 2016-02-03 DIAGNOSIS — M67471 Ganglion, right ankle and foot: Secondary | ICD-10-CM | POA: Diagnosis not present

## 2016-02-03 NOTE — Progress Notes (Signed)
Office Visit Note   Patient: Cassandra Goodman           Date of Birth: [birth year]           MRN: [id number] Visit Date: 02/03/2016              Requested by: Susy Frizzle, MD [address] [facility] [place], [place] [address] PCP: Odette Fraction, MD   Assessment & Plan: Visit Diagnoses:  1. Ganglion cyst of right foot            Recurrent ganglion post  Plan: Patient has recurrent ganglion of the right foot. Option at this point would be to refer the patient for another opinion to a [facility] orthopedic program versus impairment rating. I have discussed with her several times a day of the ganglion is reexcised and there is certainly again the potential for recurrence of the ganglion and the previous sural nerve block performed did not appear to give her any relief and even if the ganglion is removed and then recurs and is smaller she still may have residual pain. The impairment rating for the right foot would be 12%. From my standpoint patient is at [place]. Worker's Comp. carrier would refer her for another opinion if patient desires.  Follow-Up Instructions: Return if symptoms worsen or fail to improve.   Orders:  No orders of the defined types were placed in this encounter.  No orders of the defined types were placed in this encounter.     Procedures: No procedures performed   Clinical Data: No additional findings.   Subjective: Chief Complaint  Patient presents with  . Right Foot - Follow-up    Patient returns for one month follow up right foot pain. She is status post right foot ganglion excision on 11/16/2014, workman's comp. She states that the neurontin has not given her any relief. It actually makes her legs feel like something is crawling on them. She is not working at full duty yet.   Patient stay with the Neurontin at night. Patient's on the Xanax 1 mg tablet chronically and has been taking Celexa 20 mg daily for depression. Patient is a widow has continued to  work doing some standing and some sitting. Patient did not discuss with a but review of records on the Epic syste shows she had the fall at work with back pain and was evaluated by family practice physician earlier this month. She continues to complain of pain in her foot constant. She is responding at that she's continued to have pain and has not gotten relief. We did discuss recurrent excision of the cyst and patient stated concerning this "I don't know you're the doctor, I keep having pain you keep giving me medicines. I am still in pain"   she takes the Neurontin at night and the day if she takes one she states it feels like her legs are tingly and something is crawling on her legs. Presently not had a particularly good health and after being admitted to the hospital shortly expired after admission and she feels that she is all alone. Previous workup included a bone scan which showed no increased uptake no evidence of stress fracture or degenerative changes in the foot. She's had 2 MRIs 14 surgery and then 1 after she had recurrence of the ganglion. Remains multilobulated is slightly more prominent dorsally than it was on the plantar surface preop. She's had referral to [facility] orthopedic program for second opinion  and also seen Dr. Ranee Gosselin orthopedics. Review of Systems  Constitutional: Negative for activity change, chills, diaphoresis and unexpected weight change.  HENT: Negative for congestion, ear discharge, ear pain and nosebleeds.   Eyes: Negative for discharge and visual disturbance.  Respiratory: Negative for apnea, cough, choking and shortness of breath.        Positive for smoking  Cardiovascular: Negative for chest pain and palpitations.  Gastrointestinal: Negative for abdominal distention and abdominal pain.  Endocrine: Negative for cold intolerance and heat intolerance.  Genitourinary: Negative for flank pain and hematuria.  Musculoskeletal:        On-the-job injury of the lateral foot ganglion excised October 2016. She's had recurrence of ganglion cyst slightly different position but almost similar in size.  Skin: Negative for rash and wound.  Neurological: Negative for seizures and speech difficulty.  Hematological: Negative for adenopathy. Does not bruise/bleed easily.  Psychiatric/Behavioral: Negative for agitation and suicidal ideas.       Positive for depression.     Objective: Vital Signs: BP (!) 137/104   Pulse 81   Ht 5\' 7"  (1.702 m)   Wt 210 lb (95.3 kg)   BMI 32.89 kg/m   Physical Exam  Constitutional: She is oriented to person, place, and time. She appears well-developed.  HENT:  Head: Normocephalic.  Right Ear: External ear normal.  Left Ear: External ear normal.  Eyes: Pupils are equal, round, and reactive to light.  Neck: No tracheal deviation present. No thyromegaly present.  Cardiovascular: Normal rate.   Pulmonary/Chest: Effort normal.  Abdominal: Soft.  Musculoskeletal:  Positive for ganglion lateral aspect of the foot adjacent to the peroneal tendon. Some decreased sensation distally or the fifth metatarsal head. Sensation the fifth toe is intact. Pulses are normal no dorsal foot swelling. Good peroneal strength.  Neurological: She is alert and oriented to person, place, and time.  Skin: Skin is warm and dry.  Psychiatric: She has a normal mood and affect. Her behavior is normal.    Ortho Exam  Specialty Comments:  No specialty comments available.  Imaging: No results found.   PMFS History: Patient Active Problem List   Diagnosis Date Noted  . Ganglion cyst of right foot 01/06/2016  . Dyspepsia 02/25/2015  . Colon cancer screening 02/25/2015  . Smoker    Past Medical History:  Diagnosis Date  . Allergy    SEASONAL  . Bulging lumbar disc   . Depression   . Hyperlipidemia   . Hypertension   . Prediabetes   . Smoker     Family History  Problem Relation Age of Onset  . Depression  Mother   . Diabetes Mother   . Cancer Mother     ovarian  . Depression Maternal Grandmother   . Diabetes Maternal Grandmother   . Diabetes Father   . COPD Maternal Grandfather     liver, heavy alcohol  . Cancer Maternal Aunt     breast  . Cancer Maternal Uncle     throat, smoker    Past Surgical History:  Procedure Laterality Date  . ESOPHAGOGASTRODUODENOSCOPY N/A 03/19/2015   Procedure: ESOPHAGOGASTRODUODENOSCOPY (EGD);  Surgeon: Danie Binder, MD;  [facility];  Service: Endoscopy;  Laterality: N/A;  945  . GANGLION CYST EXCISION Right    foot  . LAPAROSCOPIC TUBAL LIGATION    . TUBAL LIGATION     Social History   Occupational History  . unemployed    Social History Main Topics  .  Smoking status: Current Every Day Smoker    Packs/day: 1.50    Years: 20.00    Types: Cigarettes  . Smokeless tobacco: Former Systems developer    Quit date: 06/11/2014  . Alcohol use 0.0 oz/week     Comment: OCCASIONALLY  . Drug use: No  . Sexual activity: No

## 2016-02-17 ENCOUNTER — Telehealth (INDEPENDENT_AMBULATORY_CARE_PROVIDER_SITE_OTHER): Payer: Self-pay | Admitting: Orthopaedic Surgery

## 2016-02-17 NOTE — Telephone Encounter (Signed)
Pt Workers Lawyer  Cassandra Goodman requesting visit note faxed for 12/28 to [phone] please

## 2016-02-18 NOTE — Telephone Encounter (Signed)
faxed

## 2016-03-12 ENCOUNTER — Other Ambulatory Visit: Payer: Self-pay | Admitting: Family Medicine

## 2016-03-15 ENCOUNTER — Other Ambulatory Visit: Payer: Self-pay | Admitting: Family Medicine

## 2016-03-15 MED ORDER — DEXLANSOPRAZOLE 60 MG PO CPDR: 1.0000 | DELAYED_RELEASE_CAPSULE | Freq: Every day | ORAL | 11 refills | Status: DC

## 2016-03-16 ENCOUNTER — Telehealth: Payer: Self-pay | Admitting: *Deleted

## 2016-03-16 MED ORDER — DEXLANSOPRAZOLE 60 MG PO CPDR: 1.0000 | DELAYED_RELEASE_CAPSULE | Freq: Every day | ORAL | 11 refills | Status: DC

## 2016-03-16 NOTE — Telephone Encounter (Signed)
Received request from pharmacy for [place] on [address].  PA submitted.   Dx: K21.9 (GERD), R10.13 (Dyspepsia)

## 2016-03-16 NOTE — Telephone Encounter (Signed)
Received PA determination.  ° °PA approved.  ° °Pharmacy made aware.  °

## 2016-03-31 ENCOUNTER — Other Ambulatory Visit: Payer: Self-pay | Admitting: Family Medicine

## 2016-03-31 NOTE — Telephone Encounter (Signed)
ok 

## 2016-03-31 NOTE — Telephone Encounter (Signed)
LRF 12/23/15 #30 + 2  LOV 01/10/16  OK refill?

## 2016-04-06 ENCOUNTER — Telehealth: Payer: Self-pay | Admitting: *Deleted

## 2016-04-06 NOTE — Telephone Encounter (Signed)
Received fax requesting refill on Xanax.   Ok to refill??  Last office visit 01/10/2016.  Last refill 12/23/2015.

## 2016-04-07 MED ORDER — ALPRAZOLAM 1 MG PO TABS: 1.0000 mg | ORAL_TABLET | Freq: Every evening | ORAL | 2 refills | Status: DC | PRN

## 2016-04-07 NOTE — Telephone Encounter (Signed)
Medication called to pharmacy. 

## 2016-04-07 NOTE — Telephone Encounter (Signed)
ok 

## 2016-04-10 ENCOUNTER — Other Ambulatory Visit: Payer: Self-pay | Admitting: Preventative Medicine

## 2016-04-10 DIAGNOSIS — M5416 Radiculopathy, lumbar region: Secondary | ICD-10-CM

## 2016-04-25 ENCOUNTER — Ambulatory Visit
Admission: RE | Admit: 2016-04-25 | Discharge: 2016-04-25 | Disposition: A | Discharge status: Home / Self Care | Payer: Worker's Compensation | Source: Ambulatory Visit | Attending: Preventative Medicine | Admitting: Preventative Medicine

## 2016-04-25 DIAGNOSIS — M5416 Radiculopathy, lumbar region: Secondary | ICD-10-CM

## 2016-04-25 MED ORDER — IOPAMIDOL (ISOVUE-M 200) INJECTION 41%
1.0000 mL | Freq: Once | INTRAMUSCULAR | Status: AC
  Administered 2016-04-25: 1 mL via EPIDURAL

## 2016-04-25 MED ORDER — METHYLPREDNISOLONE ACETATE 40 MG/ML INJ SUSP (RADIOLOG
120.0000 mg | Freq: Once | INTRAMUSCULAR | Status: AC
  Administered 2016-04-25: 120 mg via EPIDURAL

## 2016-04-25 NOTE — Discharge Instructions (Signed)

## 2016-05-17 ENCOUNTER — Other Ambulatory Visit: Payer: Self-pay | Admitting: Preventative Medicine

## 2016-05-17 DIAGNOSIS — M5416 Radiculopathy, lumbar region: Secondary | ICD-10-CM

## 2016-05-25 ENCOUNTER — Ambulatory Visit
Admission: RE | Admit: 2016-05-25 | Discharge: 2016-05-25 | Disposition: A | Discharge status: Home / Self Care | Payer: Worker's Compensation | Source: Ambulatory Visit | Attending: Preventative Medicine | Admitting: Preventative Medicine

## 2016-05-25 ENCOUNTER — Other Ambulatory Visit: Payer: Self-pay | Admitting: Preventative Medicine

## 2016-05-25 DIAGNOSIS — M5416 Radiculopathy, lumbar region: Secondary | ICD-10-CM

## 2016-05-25 MED ORDER — METHYLPREDNISOLONE ACETATE 40 MG/ML INJ SUSP (RADIOLOG
120.0000 mg | Freq: Once | INTRAMUSCULAR | Status: AC
  Administered 2016-05-25: 120 mg via EPIDURAL

## 2016-05-25 MED ORDER — IOPAMIDOL (ISOVUE-M 200) INJECTION 41%
1.0000 mL | Freq: Once | INTRAMUSCULAR | Status: AC
  Administered 2016-05-25: 1 mL via EPIDURAL

## 2016-05-25 NOTE — Discharge Instructions (Signed)

## 2016-06-01 ENCOUNTER — Ambulatory Visit (INDEPENDENT_AMBULATORY_CARE_PROVIDER_SITE_OTHER): Payer: Worker's Compensation | Admitting: Orthopaedic Surgery

## 2016-06-01 ENCOUNTER — Encounter (INDEPENDENT_AMBULATORY_CARE_PROVIDER_SITE_OTHER): Payer: Self-pay | Admitting: Orthopaedic Surgery

## 2016-06-01 VITALS — BP 152/115 | HR 75 | Ht 64.0 in | Wt 192.0 lb

## 2016-06-01 DIAGNOSIS — M67471 Ganglion, right ankle and foot: Secondary | ICD-10-CM | POA: Diagnosis not present

## 2016-06-01 NOTE — Progress Notes (Signed)
Office Visit Note   Patient: Cassandra Goodman           Date of Birth: 14-Mar-1965           MRN: [id number] Visit Date: 06/01/2016              Requested by: Susy Frizzle, MD [address] [facility] [place], [place] [address] PCP: Odette Fraction, MD   Assessment & Plan: Visit Diagnoses:  1. Ganglion cyst of right foot     Plan: Patient continues to have ongoing symptoms from the ganglion lateral side of her foot. It is smaller than it was on exam 02/03/2016. Her bone scan showed no degenerative changes in the foot and no evidence of stress fracture. By physical exam ganglion is about 50% reduced in size from last year exams.  Follow-Up Instructions: No Follow-up on file.   Orders:  No orders of the defined types were placed in this encounter.  No orders of the defined types were placed in this encounter.     Procedures: No procedures performed   Clinical Data: No additional findings.   Subjective: Chief Complaint  Patient presents with  . Right Foot - Follow-up    HPI patient return she had been radiated and release for her Worker's Comp. injury for the ganglion excision right foot. She continues to have problems with the foot and she states her attorney and asked her to return so the discussion could be put down in writing about her foot. I read the previous note from 02/03/2016 to her. This describes the problem with the sural nerve laterally. She had a sural nerve block but did not any relief with the nerve block. She has a recurrent ganglion at the lateral foot and as I discussed with her before some portion of the ganglion may be related to the peroneal tendon or peroneal tendon sheath and some may be from the calcaneocuboid joint. These 2 areas did not show any degenerative changes in the joint nor evidence of partial tendon tearing on her MRI scans. She continues to have pain and difficulty with activities.  Review of Systems review of systems is updated and is  unchanged from 02/03/2016. She's had problems with her back which she states is [employer] related and she is being treated in [place] for this. Patient states that she's lost about 80 pounds.   Objective: Vital Signs: BP (!) 152/115   Pulse 75   Ht 5\' 4"  (1.626 m)   Wt 192 lb (87.1 kg)   BMI 32.96 kg/m   Physical Exam patient is active peroneal tendon function. The plantar area where she had the ganglion cyst appears to have receded. She still has a 2.7 x 1.5 cm area which is more of the dorsum that the remains. By clinical exam the cyst is smaller than it was. She still tender over the scar. Toe extensors are active.  Ortho Exam  Specialty Comments:  No specialty comments available.  Imaging: No results found.   PMFS History: Patient Active Problem List   Diagnosis Date Noted  . Ganglion cyst of right foot 01/06/2016  . Dyspepsia 02/25/2015  . Colon cancer screening 02/25/2015  . Smoker    Past Medical History:  Diagnosis Date  . Allergy    SEASONAL  . Bulging lumbar disc   . Depression   . Hyperlipidemia   . Hypertension   . Prediabetes   . Smoker     Family History  Problem  Relation Age of Onset  . Depression Mother   . Diabetes Mother   . Cancer Mother     ovarian  . Depression Maternal Grandmother   . Diabetes Maternal Grandmother   . Diabetes Father   . COPD Maternal Grandfather     liver, heavy alcohol  . Cancer Maternal Aunt     breast  . Cancer Maternal Uncle     throat, smoker    Past Surgical History:  Procedure Laterality Date  . ESOPHAGOGASTRODUODENOSCOPY N/A 03/19/2015   Procedure: ESOPHAGOGASTRODUODENOSCOPY (EGD);  Surgeon: Danie Binder, MD;  [facility];  Service: Endoscopy;  Laterality: N/A;  945  . GANGLION CYST EXCISION Right    foot  . LAPAROSCOPIC TUBAL LIGATION    . TUBAL LIGATION     Social History   Occupational History  . unemployed    Social History Main Topics  . Smoking status: Current Every Day  Smoker    Packs/day: 1.50    Years: 20.00    Types: Cigarettes  . Smokeless tobacco: Former Systems developer    Quit date: 06/11/2014  . Alcohol use 0.0 oz/week     Comment: OCCASIONALLY  . Drug use: No  . Sexual activity: No

## 2016-07-06 ENCOUNTER — Other Ambulatory Visit: Payer: Self-pay | Admitting: Family Medicine

## 2016-07-06 NOTE — Telephone Encounter (Signed)
Ok to refill 

## 2016-07-06 NOTE — Telephone Encounter (Signed)
ok 

## 2016-07-06 NOTE — Telephone Encounter (Signed)
Medication called to pharmacy. 

## 2016-12-17 ENCOUNTER — Other Ambulatory Visit: Payer: Self-pay

## 2016-12-17 ENCOUNTER — Encounter (HOSPITAL_COMMUNITY): Payer: Self-pay | Admitting: *Deleted

## 2016-12-17 ENCOUNTER — Emergency Department (HOSPITAL_COMMUNITY)
Admission: EM | Admit: 2016-12-17 | Discharge: 2016-12-17 | Disposition: A | Discharge status: Other Acute Inpatient Hospital | Payer: Self-pay | Attending: Family Medicine | Admitting: Family Medicine

## 2016-12-17 DIAGNOSIS — T50902A Poisoning by unspecified drugs, medicaments and biological substances, intentional self-harm, initial encounter: Secondary | ICD-10-CM | POA: Insufficient documentation

## 2016-12-17 DIAGNOSIS — F322 Major depressive disorder, single episode, severe without psychotic features: Secondary | ICD-10-CM | POA: Insufficient documentation

## 2016-12-17 DIAGNOSIS — R45851 Suicidal ideations: Secondary | ICD-10-CM | POA: Insufficient documentation

## 2016-12-17 DIAGNOSIS — Z79899 Other long term (current) drug therapy: Secondary | ICD-10-CM | POA: Insufficient documentation

## 2016-12-17 DIAGNOSIS — F10920 Alcohol use, unspecified with intoxication, uncomplicated: Secondary | ICD-10-CM | POA: Insufficient documentation

## 2016-12-17 DIAGNOSIS — Y908 Blood alcohol level of 240 mg/100 ml or more: Secondary | ICD-10-CM | POA: Insufficient documentation

## 2016-12-17 DIAGNOSIS — F17210 Nicotine dependence, cigarettes, uncomplicated: Secondary | ICD-10-CM | POA: Insufficient documentation

## 2016-12-17 DIAGNOSIS — Z046 Encounter for general psychiatric examination, requested by authority: Secondary | ICD-10-CM | POA: Insufficient documentation

## 2016-12-17 DIAGNOSIS — I10 Essential (primary) hypertension: Secondary | ICD-10-CM | POA: Insufficient documentation

## 2016-12-17 LAB — COMPREHENSIVE METABOLIC PANEL
ALK PHOS: 81 U/L (ref 38–126)
ALT: 38 U/L (ref 14–54)
AST: 35 U/L (ref 15–41)
Albumin: 4.6 g/dL (ref 3.5–5.0)
Anion gap: 12 (ref 5–15)
BUN: 10 mg/dL (ref 6–20)
CALCIUM: 10 mg/dL (ref 8.9–10.3)
CO2: 23 mmol/L (ref 22–32)
CREATININE: 0.61 mg/dL (ref 0.44–1.00)
Chloride: 108 mmol/L (ref 101–111)
GFR calc Af Amer: >60 mL/min (ref >60)
Glucose, Bld: 116 mg/dL — ABNORMAL HIGH (ref 65–99)
Potassium: 3.2 mmol/L — ABNORMAL LOW (ref 3.5–5.1)
Sodium: 143 mmol/L (ref 135–145)
Total Bilirubin: 0.7 mg/dL (ref 0.3–1.2)
Total Protein: 8 g/dL (ref 6.5–8.1)

## 2016-12-17 LAB — I-STAT BETA HCG BLOOD, ED (MC, WL, AP ONLY): HCG, QUANTITATIVE: 7.5 m[IU]/mL — AB (ref <5)

## 2016-12-17 LAB — RAPID URINE DRUG SCREEN, HOSP PERFORMED
Amphetamines: NOT DETECTED
Barbiturates: NOT DETECTED
Benzodiazepines: NOT DETECTED
Cocaine: NOT DETECTED
OPIATES: NOT DETECTED
TETRAHYDROCANNABINOL: NOT DETECTED

## 2016-12-17 LAB — PREGNANCY, URINE: PREG TEST UR: NEGATIVE

## 2016-12-17 LAB — CBC
HCT: 51.9 % — ABNORMAL HIGH (ref 36.0–46.0)
HEMOGLOBIN: 17.4 g/dL — AB (ref 12.0–15.0)
MCH: 32.2 pg (ref 26.0–34.0)
MCHC: 33.5 g/dL (ref 30.0–36.0)
MCV: 96.1 fL (ref 78.0–100.0)
PLATELETS: 183 10*3/uL (ref 150–400)
RBC: 5.4 MIL/uL — ABNORMAL HIGH (ref 3.87–5.11)
RDW: 14.6 % (ref 11.5–15.5)
WBC: 7.5 10*3/uL (ref 4.0–10.5)

## 2016-12-17 LAB — SALICYLATE LEVEL

## 2016-12-17 LAB — ACETAMINOPHEN LEVEL: Acetaminophen (Tylenol), Serum: 18 ug/mL (ref 10–30)

## 2016-12-17 LAB — ETHANOL: ALCOHOL ETHYL (B): 247 mg/dL — AB (ref <10)

## 2016-12-17 MED ORDER — SODIUM CHLORIDE 0.9 % IV BOLUS (SEPSIS)
500.0000 mL | Freq: Once | INTRAVENOUS | Status: AC
  Administered 2016-12-17: 500 mL via INTRAVENOUS

## 2016-12-17 MED ORDER — LORAZEPAM 1 MG PO TABS: 0.0000 mg | ORAL_TABLET | Freq: Four times a day (QID) | ORAL | Status: DC

## 2016-12-17 MED ORDER — LORAZEPAM 2 MG/ML IJ SOLN: 0.0000 mg | Freq: Two times a day (BID) | INTRAMUSCULAR | Status: DC

## 2016-12-17 MED ORDER — VITAMIN B-1 100 MG PO TABS: 100.0000 mg | ORAL_TABLET | Freq: Every day | ORAL | Status: DC

## 2016-12-17 MED ORDER — THIAMINE HCL 100 MG/ML IJ SOLN: 100.0000 mg | Freq: Every day | INTRAMUSCULAR | Status: DC

## 2016-12-17 MED ORDER — LORAZEPAM 2 MG/ML IJ SOLN: 0.0000 mg | Freq: Four times a day (QID) | INTRAMUSCULAR | Status: DC

## 2016-12-17 MED ORDER — SODIUM CHLORIDE 0.9 % IV BOLUS (SEPSIS)
1000.0000 mL | Freq: Once | INTRAVENOUS | Status: AC
  Administered 2016-12-17: 1000 mL via INTRAVENOUS

## 2016-12-17 MED ORDER — LORAZEPAM 1 MG PO TABS: 0.0000 mg | ORAL_TABLET | Freq: Two times a day (BID) | ORAL | Status: DC

## 2016-12-17 NOTE — ED Notes (Signed)
Pt tearful, stating she wants to go home. Explained to pt she is not able to leave until evaluated by [facility]. Pt informed if she attempts to leave she will be made IVC & the police will be notified & she will be restrained if needed. Pt asking to use the phone to call family, informed she will be able to make 3 calls during the day.

## 2016-12-17 NOTE — ED Provider Notes (Signed)
[facility] EMERGENCY DEPARTMENT Provider Note   CSN: [id number] Arrival date & time: 12/17/16  0017  Time seem 01:05 AM   History   Chief Complaint Chief Complaint  Patient presents with  . V70.1    HPI Cassandra Goodman is a 51 y.o. female.  HPI patient reports between 46 and 9 PM she took 1 Celexa, 1 over-the-counter sleeping pill, 1 over-the-counter allergy pill, and maybe 1 more pill.  She states she took the pills "to calm down".  She also states she drank 4 shots of brandy and 4 shots of some other type of alcohol that she bought yesterday.  She states she has had a bad couple of days.  She states her husband died on Aug 31, 2015 and this 08/31/22 she was asked to resign from her job because of [employer]. injury.  She reports she is applying for disability but has not gotten it yet.  She is living off her Workmen's Comp. settlement.  She had a bulging disc in her back and a severe sprain of her right foot that required surgery.  She states tonight she texted her children to tell them she loves them which she states is not unusual.  However Her Daughter states she sent a text to ger son and said she loved them and  requested that he make sure his children, her grandchildren, remembered her.  This made her children call EMS and go to her apartment to check on her.  Patient states she was in her bed when they all arrived.  She states she has never seen a psychiatrist or therapist.  She states she has been depressed since the death of her husband.  She was being treated for her depression by her primary care doctor and was prescribed Celexa however she lost her insurance on July fifth and ran out of her medication on July 8.  PCP none  Past Medical History:  Diagnosis Date  . Allergy    SEASONAL  . Bulging lumbar disc   . Depression   . Hyperlipidemia   . Hypertension   . Prediabetes   . Smoker     Patient Active Problem List   Diagnosis Date Noted  . Ganglion cyst of right  foot 01/06/2016  . Dyspepsia 02/25/2015  . Colon cancer screening 02/25/2015  . Smoker     Past Surgical History:  Procedure Laterality Date  . GANGLION CYST EXCISION Right    foot  . LAPAROSCOPIC TUBAL LIGATION    . TUBAL LIGATION      OB History    Gravida Para Term Preterm AB Living   2 2 0 0 0 2   SAB TAB Ectopic Multiple Live Births   0 0 0 0         Home Medications    Prior to Admission medications   Medication Sig Start Date End Date Taking? Authorizing Provider  ALPRAZolam Duanne Moron) 1 MG tablet TAKE 1 TABLET BY MOUTH ONCE DAILY AT BEDTIME AS NEEDED FOR ANXIETY/SLEEP 07/06/16   Susy Frizzle, MD  atorvastatin (LIPITOR) 40 MG tablet TAKE ONE TABLET BY MOUTH ONCE DAILY 12/23/15   Susy Frizzle, MD  citalopram (CELEXA) 20 MG tablet TAKE ONE TABLET BY MOUTH ONCE DAILY 03/13/16   Susy Frizzle, MD  dexlansoprazole (DEXILANT) 60 MG capsule Take 1 capsule (60 mg total) by mouth daily. 03/16/16   Susy Frizzle, MD  gabapentin (NEURONTIN) 100 MG capsule Take 1 capsule (100 mg  total) by mouth at bedtime. Patient not taking: Reported on 06/01/2016 01/06/16   Marybelle Killings, MD  lisinopril-hydrochlorothiazide (PRINZIDE,ZESTORETIC) 20-25 MG tablet TAKE ONE TABLET BY MOUTH ONCE DAILY 03/13/16   Susy Frizzle, MD  traMADol (ULTRAM) 50 MG tablet Take 50 mg by mouth every 6 (six) hours as needed. pain 02/05/15   [provider]    Family History Family History  Problem Relation Age of Onset  . Depression Mother   . Diabetes Mother   . Cancer Mother        ovarian  . Depression Maternal Grandmother   . Diabetes Maternal Grandmother   . Diabetes Father   . COPD Maternal Grandfather        liver, heavy alcohol  . Cancer Maternal Aunt        breast  . Cancer Maternal Uncle        throat, smoker    Social History Social History   Tobacco Use  . Smoking status: Current Every Day Smoker    Packs/day: 1.50    Years: 20.00    Pack years: 30.00    Types:  Cigarettes  . Smokeless tobacco: Former Systems developer    Quit date: 06/11/2014  Substance Use Topics  . Alcohol use: Yes    Alcohol/week: 0.0 oz    Comment: OCCASIONALLY  . Drug use: No  widowed Applying for disability Smokes 1/3 ppd   Allergies   Morphine and related   Review of Systems Review of Systems  All other systems reviewed and are negative.    Physical Exam Updated Vital Signs BP (!) 176/98   Pulse 67   Temp 97.7 F (36.5 C) (Oral)   Resp 20   Ht 5\' 4"  (1.626 m)   Wt 77.1 kg (170 lb)   SpO2 99%   BMI 29.18 kg/m   Vital signs normal except hypertension   Physical Exam  Constitutional: She is oriented to person, place, and time. She appears well-developed and well-nourished.  Non-toxic appearance. She does not appear ill. She appears distressed.  Arrived sobbing via EMS  HENT:  Head: Normocephalic and atraumatic.  Right Ear: External ear normal.  Left Ear: External ear normal.  Nose: Nose normal. No mucosal edema or rhinorrhea.  Mouth/Throat: Mucous membranes are dry. No dental abscesses or uvula swelling.  Eyes: Conjunctivae and EOM are normal. Pupils are equal, round, and reactive to light.  Neck: Normal range of motion and full passive range of motion without pain. Neck supple.  Cardiovascular: Normal rate, regular rhythm and normal heart sounds. Exam reveals no gallop and no friction rub.  No murmur heard. Pulmonary/Chest: Effort normal and breath sounds normal. No respiratory distress. She has no wheezes. She has no rhonchi. She has no rales. She exhibits no tenderness and no crepitus.  Abdominal: Soft. Normal appearance and bowel sounds are normal. She exhibits no distension. There is no tenderness. There is no rebound and no guarding.  Musculoskeletal: Normal range of motion. She exhibits no edema or tenderness.  Moves all extremities well.   Neurological: She is alert and oriented to person, place, and time. She has normal strength. No cranial nerve  deficit.  Skin: Skin is warm, dry and intact. No rash noted. No erythema. No pallor.  Psychiatric: Her affect is labile. Her speech is slurred. She is slowed. She exhibits a depressed mood. She expresses no homicidal ideation.  Nursing note and vitals reviewed.    ED Treatments / Results  Labs (all labs ordered  are listed, but only abnormal results are displayed) Results for orders placed or performed during the hospital encounter of 12/17/16  Comprehensive metabolic panel  Result Value Ref Range   Sodium 143 135 - 145 mmol/L   Potassium 3.2 (L) 3.5 - 5.1 mmol/L   Chloride 108 101 - 111 mmol/L   CO2 23 22 - 32 mmol/L   Glucose, Bld 116 (H) 65 - 99 mg/dL   BUN 10 6 - 20 mg/dL   Creatinine, Ser 0.61 0.44 - 1.00 mg/dL   Calcium 10.0 8.9 - 10.3 mg/dL   Total Protein 8.0 6.5 - 8.1 g/dL   Albumin 4.6 3.5 - 5.0 g/dL   AST 35 15 - 41 U/L   ALT 38 14 - 54 U/L   Alkaline Phosphatase 81 38 - 126 U/L   Total Bilirubin 0.7 0.3 - 1.2 mg/dL   GFR calc non Af Amer >60 >60 mL/min   GFR calc Af Amer >60 >60 mL/min   Anion gap 12 5 - 15  Ethanol  Result Value Ref Range   Alcohol, Ethyl (B) 247 (H) <40 mg/dL  Salicylate level  Result Value Ref Range   Salicylate Lvl <9.8 2.8 - 30.0 mg/dL  Acetaminophen level  Result Value Ref Range   Acetaminophen (Tylenol), Serum 18 10 - 30 ug/mL  cbc  Result Value Ref Range   WBC 7.5 4.0 - 10.5 K/uL   RBC 5.40 (H) 3.87 - 5.11 MIL/uL   Hemoglobin 17.4 (H) 12.0 - 15.0 g/dL   HCT 51.9 (H) 36.0 - 46.0 %   MCV 96.1 78.0 - 100.0 fL   MCH 32.2 26.0 - 34.0 pg   MCHC 33.5 30.0 - 36.0 g/dL   RDW 14.6 11.5 - 15.5 %   Platelets 183 150 - 400 K/uL  Rapid urine drug screen (hospital performed)  Result Value Ref Range   Opiates NONE DETECTED NONE DETECTED   Cocaine NONE DETECTED NONE DETECTED   Benzodiazepines NONE DETECTED NONE DETECTED   Amphetamines NONE DETECTED NONE DETECTED   Tetrahydrocannabinol NONE DETECTED NONE DETECTED   Barbiturates NONE DETECTED  NONE DETECTED  Pregnancy, urine  Result Value Ref Range   Preg Test, Ur NEGATIVE NEGATIVE  I-Stat beta hCG blood, ED  Result Value Ref Range   I-stat hCG, quantitative 7.5 (H) <5 mIU/mL   Comment 3           Laboratory interpretation all normal except hypokalemia, alcohol intoxication, elevated Hb c/w dehydration    EKG  EKG Interpretation None       Radiology No results found.  Procedures Procedures (including critical care time)  Medications Ordered in ED Medications  LORazepam (ATIVAN) injection 0-4 mg (0 mg Intravenous Not Given 12/17/16 0413)    Or  LORazepam (ATIVAN) tablet 0-4 mg ( Oral See Alternative 12/17/16 0413)  LORazepam (ATIVAN) injection 0-4 mg (not administered)    Or  LORazepam (ATIVAN) tablet 0-4 mg (not administered)  thiamine (VITAMIN B-1) tablet 100 mg (not administered)    Or  thiamine (B-1) injection 100 mg (not administered)  sodium chloride 0.9 % bolus 1,000 mL (0 mLs Intravenous Stopped 12/17/16 0317)  sodium chloride 0.9 % bolus 500 mL (0 mLs Intravenous Stopped 12/17/16 0254)     Initial Impression / Assessment and Plan / ED Course  I have reviewed the triage vital signs and the nursing notes.  Pertinent labs & imaging results that were available during my care of the patient were reviewed by me and considered in  my medical decision making (see chart for details).    Laboratory testing was ordered.  4:05 AM I have reviewed her test results, the nursing staff also spoke to poison control who said patient was medically cleared 4-6 hours after her ingestion or 4 hours after arriving to the ED.  She told me she took the pills between 730 and 9.  Psychiatric holding orders were written including TTS consult.  Patient has been sleeping during the night in no distress.  6:45 AM patient is waiting for TTS consult.  Final Clinical Impressions(s) / ED Diagnoses   Final diagnoses:  Alcoholic intoxication without complication ([place])    Intentional drug overdose, initial encounter ([place])  Current severe episode of major depressive disorder without psychotic features without prior episode ([place])  Suicidal ideation    Disposition pending  Rolland Porter, MD, Barbette Or, MD 12/17/16 [phone]

## 2016-12-17 NOTE — ED Notes (Signed)
Poison control contacted. Advised to monitor for 4 hours, IV fluids bolus.

## 2016-12-17 NOTE — ED Notes (Signed)
Per family pt sent multiple text telling family she loved them & wanted to make sure everyone remembered her.

## 2016-12-17 NOTE — ED Triage Notes (Signed)
Pt arrived to er by [facility] after taking a "bunch of pills" pt stated to ems that she wanted to kill herself, pt stated to triage RN that she was missing her spouse who died a year ago, when RN asked pt about wanting to harm herself pt states "no then stated maybe"

## 2016-12-17 NOTE — ED Notes (Signed)
Pt states she took an unknown amount of pills tonight. Says she lost her husband a yaer & 1/2 ago. Says she took pills to try to sleep & to stop thinking. Pt tearful at present & wanting to go home. Pt informed she has to be medically cleared & will have to speak to [facility] before allowed to leave.

## 2016-12-17 NOTE — BH Assessment (Addendum)
Tele Assessment Note   Patient Name: Cassandra Goodman MRN: [id number] Referring Physician :Tomi Bamberger, MD Location [facility]:  [facility] Location of Provider: [place] is an 51 y.o. female who presented in the ED after having ingested several pills and drinking alcohol in a possible suicide attempt and texting her children telling them to make sure that her grandchildren remembered her.  Patient states that she has been depressed for the past year and a half since her husband died and she lost her job.  Patient states that she "has a moment" yesterday and she states that she was a bit more depressed than usual.  Patient states that she normally drinks 12 beers on the weekends, but last night she was drinking brandy.  She states that she took a one celexa, an allergy pill, an OTC pill and possibly another pill.  She states that she took the pills "to calm down" and states that she was not suicidal.  She did not deny sending the text to her children.  Patient states that she lives alone, she states that she has not been able to find a job and she has a pending DWI.  She states that she is struggling financially and has no insurance and had to stop taking her medications because she cannot afford to go to the doctor anymore.  She states that her sleep and appetite are not normal.  She states that she sleeps up to twelve hours during the day and sometimes just two hours in a day.  She states that she has been trying to lose weight, but also has no appetite.  She states that she has lost 120 pounds in the past year and a half.  Patient appears be de depressed, irritated and anxious.  She is alert and oriented.  Patient denies HI/Psychosis.  Patient states that she is no longer suicidal and states, "I just want to go home, being here is making things worse."  Patient states that she has no prior suicide attempts.  Patient states that she has never had any mental health or substance  abuse treatment in the past.  She states that she is willing to seek services if she is allowed to leave the hospital and states that her daughter would stay with her.  Informed patient that since this attempt was out of character for her and since she texted her children that she was suicidal that her case would have to be staffed with provider for final determination.  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  Past Medical History:  Past Medical History:  Diagnosis Date  . Allergy    SEASONAL  . Bulging lumbar disc   . Depression   . Hyperlipidemia   . Hypertension   . Prediabetes   . Smoker     Past Surgical History:  Procedure Laterality Date  . GANGLION CYST EXCISION Right    foot  . LAPAROSCOPIC TUBAL LIGATION    . TUBAL LIGATION      Family History:  Family History  Problem Relation Age of Onset  . Depression Mother   . Diabetes Mother   . Cancer Mother        ovarian  . Depression Maternal Grandmother   . Diabetes Maternal Grandmother   . Diabetes Father   . COPD Maternal Grandfather        liver, heavy alcohol  . Cancer Maternal Aunt  breast  . Cancer Maternal Uncle        throat, smoker    Social History:  reports that she has been smoking cigarettes.  She has a 30.00 pack-year smoking history. She quit smokeless tobacco use about 2 years ago. She reports that she drinks about 7.2 oz of alcohol per week. She reports that she does not use drugs.  Additional Social History:  Alcohol / Drug Use Pain Medications: denies Prescriptions: denies Over the Counter: denies History of alcohol / drug use?: Yes Longest period of sobriety (when/how long): patient states that she has been drinking for the past year and a half since her hubsan died.  Patient states that she primarily drinks on weekends Substance #1 Name of Substance 1: alcohol 1 - Age of First Use: 17 1 - Amount (size/oz): 12 beers 1 - Frequency:  weekends 1 - Duration: for the past year and a half 1 - Last Use / Amount: Last pm, four shots of brandy  CIWA: CIWA-Ar BP: (!) 151/98 Pulse Rate: 78 Nausea and Vomiting: no nausea and no vomiting Tactile Disturbances: none Tremor: no tremor Auditory Disturbances: not present Paroxysmal Sweats: no sweat visible Visual Disturbances: not present Anxiety: no anxiety, at ease Headache, Fullness in Head: none present Agitation: normal activity Orientation and Clouding of Sensorium: oriented and can do serial additions CIWA-Ar Total: 0 COWS:    PATIENT STRENGTHS: (choose at least two) Capable of independent living General fund of knowledge Supportive family/friends  Allergies:  Allergies  Allergen Reactions  . Morphine And Related Shortness Of Breath and Itching    Sweating    Home Medications:  (Not in a hospital admission)  OB/GYN Status:  No LMP recorded. Patient has had an ablation.  General Assessment Data [facility] of [facility] TTS Assessment: In system Is this a Tele or Face-to-Face Assessment?: Tele Assessment Is this an Initial Assessment or a Re-assessment for this encounter?: Initial Assessment Marital status: Widowed [place] name: (not assessed) Is patient pregnant?: No Pregnancy Status: No Living Arrangements: Alone Can pt return to current living arrangement?: Yes Admission Status: Voluntary Is patient capable of signing voluntary admission?: Yes Referral Source: MD Insurance type: (none)     Crisis Care Plan Living Arrangements: Alone Legal Guardian: Other:(self) Name of Psychiatrist: (none) Name of Therapist: (none)  Education Status Is patient currently in school?: No Current Grade: (NA) Highest grade of school patient has completed: 12th Name of school: (not assessed) Contact person: (none)  Risk to self with the past 6 months Suicidal Ideation: Yes-Currently Present Has patient been a risk to self within the past 6 months prior  to admission? : No Suicidal Intent: Yes-Currently Present Has patient had any suicidal intent within the past 6 months prior to admission? : No Is patient at risk for suicide?: Yes Suicidal Plan?: Yes-Currently Present Has patient had any suicidal plan within the past 6 months prior to admission? : No Specify Current Suicidal Plan: (patient attempted to OD on pills and alcohol) Access to Means: Yes Specify Access to Suicidal Means: (took pills) What has been your use of drugs/alcohol within the last 12 months?: (drinking a 12 pakc of beer on weekends) Previous Attempts/Gestures: No How many times?: (none) Other Self Harm Risks: (widowed and unemployed with minimal support) Triggers for Past Attempts: None known Intentional Self Injurious Behavior: None Family Suicide History: No Recent stressful life event(s): Job Loss, Other (Comment)(husband died) Persecutory voices/beliefs?: No Depression: Yes Depression Symptoms: Tearfulness, Isolating, Loss of interest in usual  pleasures, Feeling worthless/self pity Substance abuse history and/or treatment for substance abuse?: Yes Suicide prevention information given to non-admitted patients: Not applicable  Risk to Others within the past 6 months Homicidal Ideation: No Does patient have any lifetime risk of violence toward others beyond the six months prior to admission? : No Thoughts of Harm to Others: No Current Homicidal Intent: No Current Homicidal Plan: No Access to Homicidal Means: No Identified Victim: (none) History of harm to others?: No Assessment of Violence: On admission Violent Behavior Description: (none) Does patient have access to weapons?: No Criminal Charges Pending?: Yes Does patient have a court date: Yes(has a DWI pending) Court Date: (not known) Is patient on probation?: No  Psychosis Hallucinations: None noted Delusions: None noted  Mental Status Report Appearance/Hygiene: Disheveled, In scrubs Eye Contact:  Fair Motor Activity: Restlessness Speech: Logical/coherent Level of Consciousness: Alert Mood: Depressed, Anxious, Apathetic Anxiety Level: Moderate Thought Processes: Coherent Judgement: Impaired Orientation: Person, Place, Time, Situation Obsessive Compulsive Thoughts/Behaviors: None  Cognitive Functioning Concentration: Normal Memory: Recent Intact, Remote Intact IQ: Average Insight: Fair Impulse Control: Fair Appetite: Poor Weight Loss: (120 lbs in past year and a half) Weight Gain: (none) Sleep: (2-12 hours) Total Hours of Sleep: (2-12 hours) Vegetative Symptoms: Decreased grooming  ADLScreening [facility] Assessment Services) Patient's cognitive ability adequate to safely complete daily activities?: Yes Patient able to express need for assistance with ADLs?: Yes Independently performs ADLs?: Yes (appropriate for developmental age)  Prior Inpatient Therapy Prior Inpatient Therapy: No Prior Therapy Dates: (none) Prior Therapy Facilty/Provider(s): none Reason for Treatment: none  Prior Outpatient Therapy Prior Outpatient Therapy: No Prior Therapy Dates: (none) Prior Therapy Facilty/Provider(s): (none) Reason for Treatment: (NA) Does patient have an ACCT team?: No Does patient have [facility] services? : No Does patient have P4CC services?: No  ADL Screening (condition at time of admission) Patient's cognitive ability adequate to safely complete daily activities?: Yes Is the patient deaf or have difficulty hearing?: No Does the patient have difficulty seeing, even when wearing glasses/contacts?: No Does the patient have difficulty concentrating, remembering, or making decisions?: No Patient able to express need for assistance with ADLs?: Yes Does the patient have difficulty dressing or bathing?: No Independently performs ADLs?: Yes (appropriate for developmental age) Does the patient have difficulty walking or climbing stairs?: No Weakness of Legs: None Weakness of  Arms/Hands: None       Abuse/Neglect Assessment (Assessment to be complete while patient is alone) Abuse/Neglect Assessment Can Be Completed: Yes Physical Abuse: Denies Verbal Abuse: Denies Sexual Abuse: Denies Exploitation of patient/patient's resources: Denies Self-Neglect: Denies Values / Beliefs Cultural Requests During Hospitalization: None Spiritual Requests During Hospitalization: None Consults Spiritual Care Consult Needed: No Social Work Consult Needed: No Regulatory affairs officer (For Healthcare) Does Patient Have a Medical Advance Directive?: No    Additional Information 1:1 In Past 12 Months?: No CIRT Risk: No Elopement Risk: No Does patient have medical clearance?: No  Child/Adolescent Assessment Running Away Risk: Denies Bed-Wetting: Denies Destruction of Property: Denies Cruelty to Animals: Denies Stealing: Denies Rebellious/Defies Authority: Denies Satanic Involvement: Denies Science writer: Denies Problems at [employer]: Denies Gang Involvement: Denies  Disposition: Per Zerita Boers, FNP, Patient needs inpt psych treatment Informed Dr. Dayna Barker of disposition at 9:47 am Disposition Initial Assessment Completed for this Encounter: Yes Disposition of Patient: Pending Review with psychiatrist  This service was provided via telemedicine using a 2-way, interactive audio and video technology.  Names of all persons participating in this telemedicine service and their role in this encounter.  Name:Marlon Vonruden Role: TTS clinician  Name: Cassandra Goodman Role: patient  Name:  Role:  Name:  Role:    Reatha Armour 12/17/2016 8:10 AM

## 2016-12-17 NOTE — ED Notes (Signed)
Daughter visited at this time and was informed of pt's placement at [facility] [phone]. Awaiting IVC paperwork from the police department at this time so pt can be transferred.

## 2016-12-17 NOTE — ED Notes (Addendum)
Gina from poison control called to get update on this patient. Prior QRS 115, if repeat EKG is 120 or greater we should consider giving a bicarb bolus (not a drip). Also, prior QTc was 473, if this number increases to 500 on repeat we should do a re-draw of blood work to check potassium and mg and if low they should replenish to get to the high level or normal.

## 2016-12-17 NOTE — ED Notes (Signed)
Medications bought w/ EMS included, Naproxen 500mg , ASA 81mg , neurontin 100mg , cafine tablets, zestoretic 20-25, citalopram 20mg , Acetaminophen / benydral,

## 2016-12-17 NOTE — ED Notes (Signed)
Pt undergoing TTS at this time

## 2016-12-17 NOTE — ED Notes (Signed)
Pt undergoing TTS.  

## 2016-12-17 NOTE — ED Provider Notes (Signed)
Pt accepted to [facility], Dr. Arsenio Loader. Will transfer stable.    Francine Graven, DO 12/17/16 2334

## 2016-12-17 NOTE — ED Notes (Signed)
Poison Control Contacted at this time and updated on pt's status.

## 2016-12-17 NOTE — Progress Notes (Signed)
LCSW following for disposition:  Patient has been accepted to [facility] [address]   [place], [place] [address] Report:  [phone] press option 3 to get to RN. Accepting Physician:  Dr. Doristine Church, MD.  Patient will need to be IVC for transport and potential flight risk as she has been minimizing behaviors and hospital feels she may try to leave when she arrives. Fax:  [phone] (please send IVC paperwork)  Patient can arrive once paperwork has been completed and served per Bill with admissions. [facility] can be reached at :  [phone] or [phone]  LCSW discussed with Charge RN: Magda Paganini at [facility] and relayed all information.  If there are needs or questions please call.  Lane Hacker, MSW Clinical Social Work: [employer] Coverage for :  [phone]

## 2016-12-17 NOTE — Progress Notes (Signed)
LCSW following for disposition:  Disposition: Per Zerita Boers, FNP, Patient needs inpt psych treatment Informed Dr. Dayna Barker of disposition at 9:47 am Disposition  Patient has been referred to the following [place]: no current beds [place]  [place].  Will continue to follow up once referrals reviewed.   Lane Hacker, MSW Clinical Social Work: Printmaker Coverage for :  [phone]

## 2016-12-27 ENCOUNTER — Inpatient Hospital Stay: Payer: Self-pay | Admitting: Family Medicine

## 2017-01-10 ENCOUNTER — Encounter: Payer: Self-pay | Admitting: Gastroenterology

## 2017-05-07 IMAGING — MR MR FOOT*R* W/O CM
3 of 5 series · 8 of 40 positions shown · non-contrast
Comparison: 09/23/2014

CLINICAL DATA: Fall at work injuring the right foot. Prior cyst
removal from the right foot 11/16/2014. Continued pain.

EXAM:
MRI OF THE RIGHT HINDFOOT WITHOUT CONTRAST
TECHNIQUE: Multiplanar, multisequence MR imaging of the ankle was performed. No
intravenous contrast was administered.

[Series 3: PD fat-sat · axial · right · 4.0mm · 0.21mm/px · z∈[-88,-30]mm · 3 of 20 slices shown]
[im 4/20]
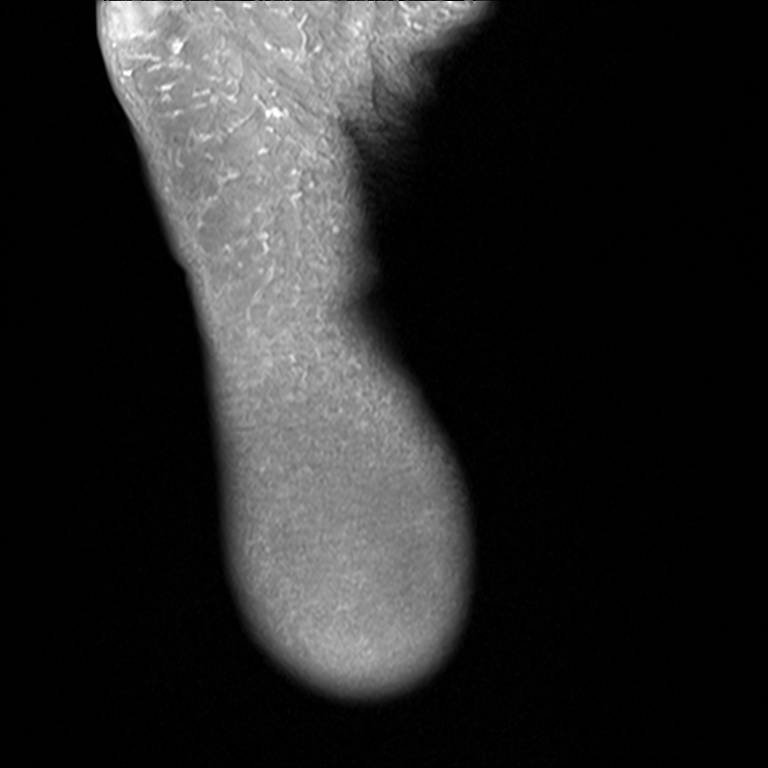
[im 10/20]
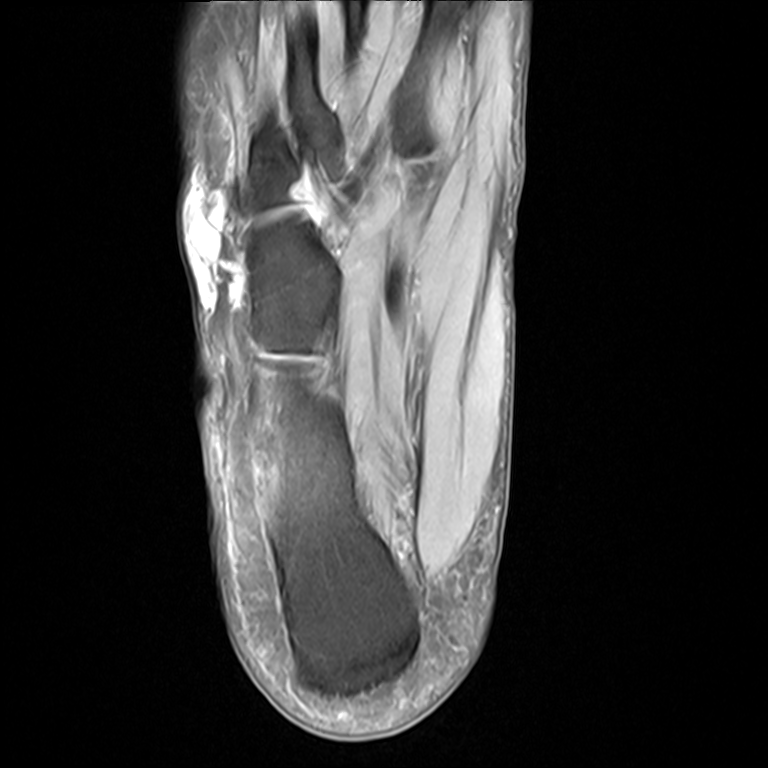
[im 16/20]
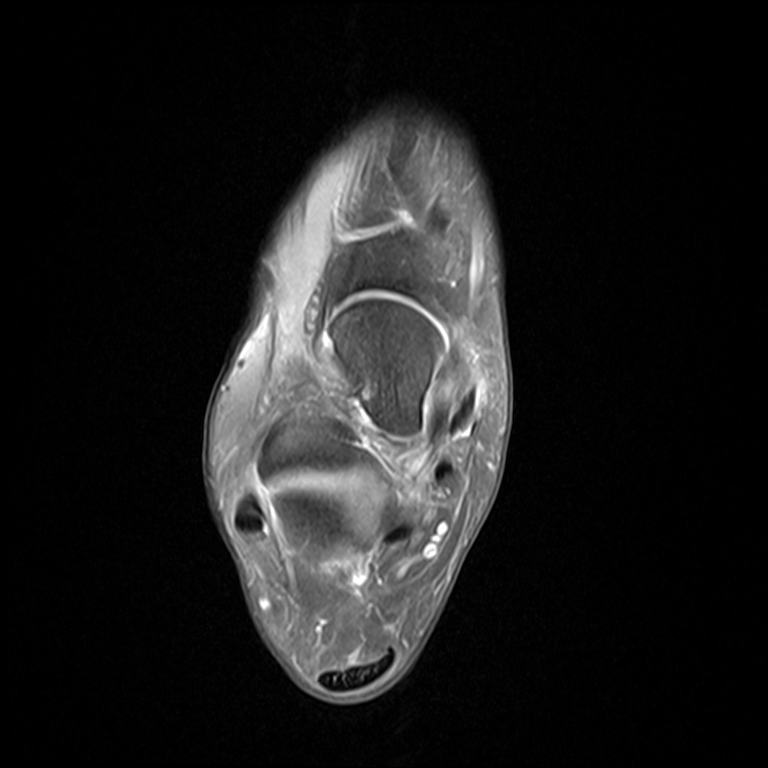

[Series 5: T2 fat-sat · sagittal · right · 3.0mm · 0.21mm/px · 3 of 25 slices shown (1 of 2)]
[im 4/25]
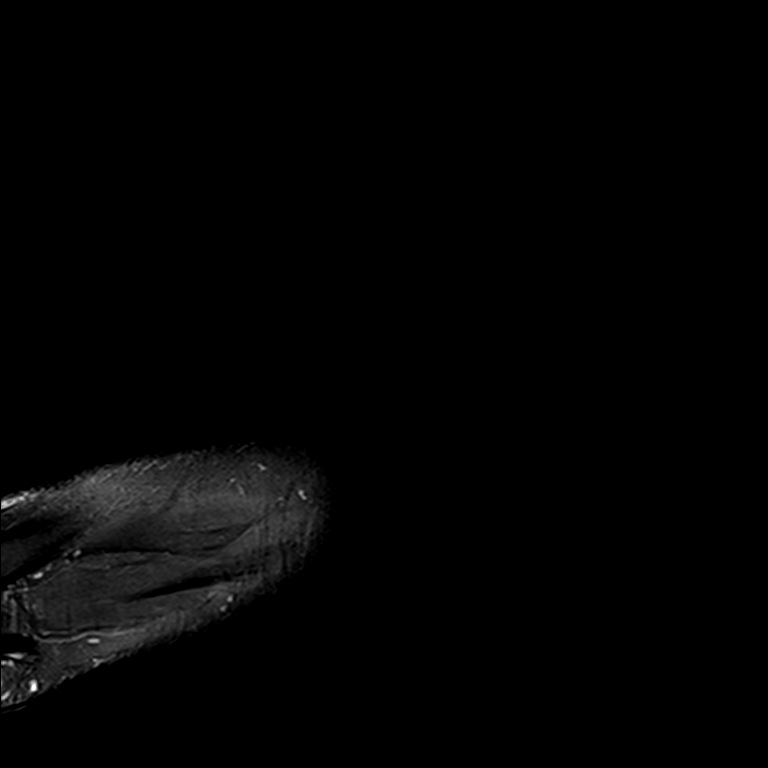
[im 14/25]
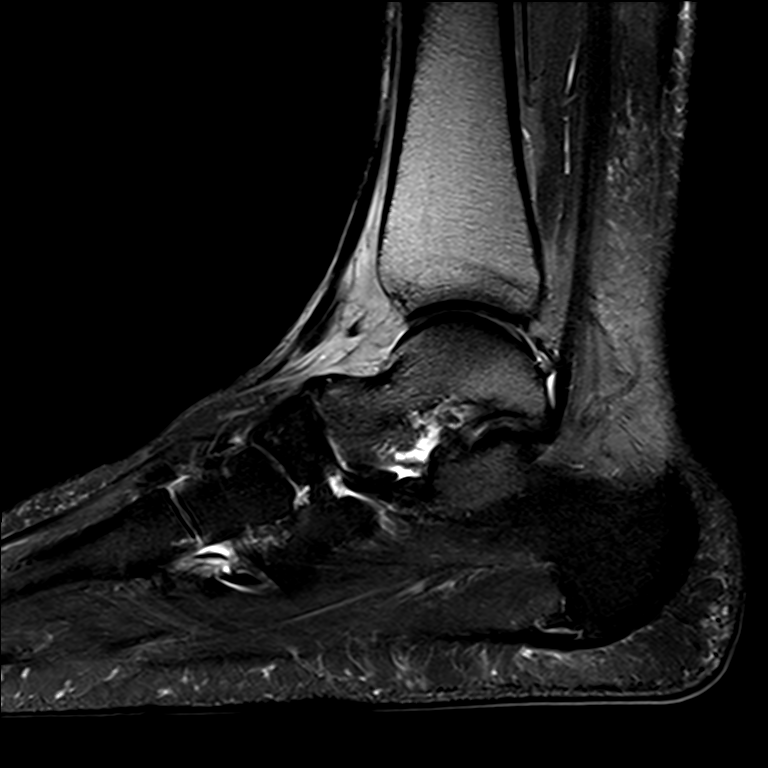
[im 21/25]
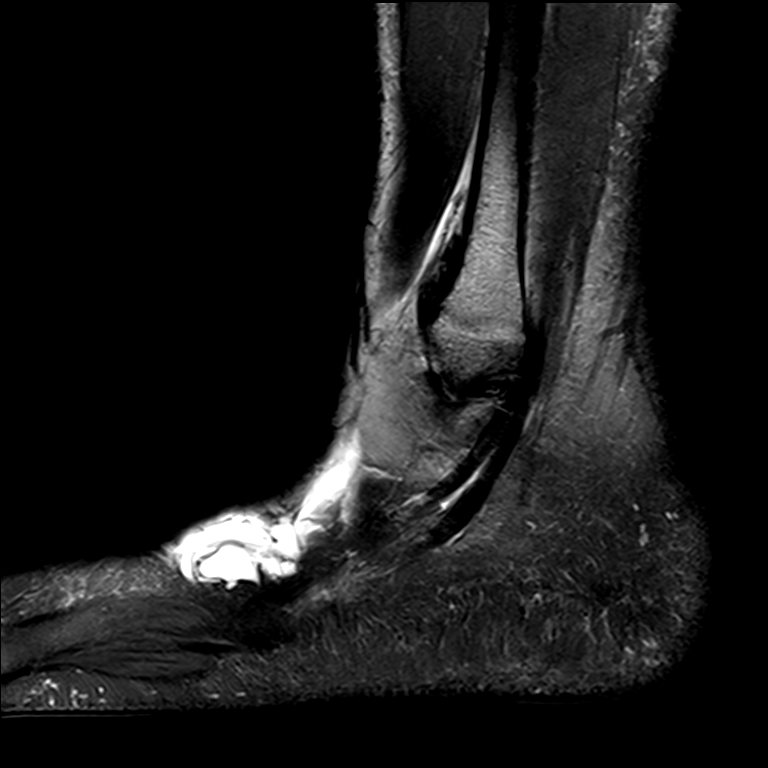

[Series 6: T2 fat-sat · axial · right · 4.0mm · 0.20mm/px · z∈[-88,-49]mm · 2 of 20 slices shown (2 of 2)]
[im 4/20]
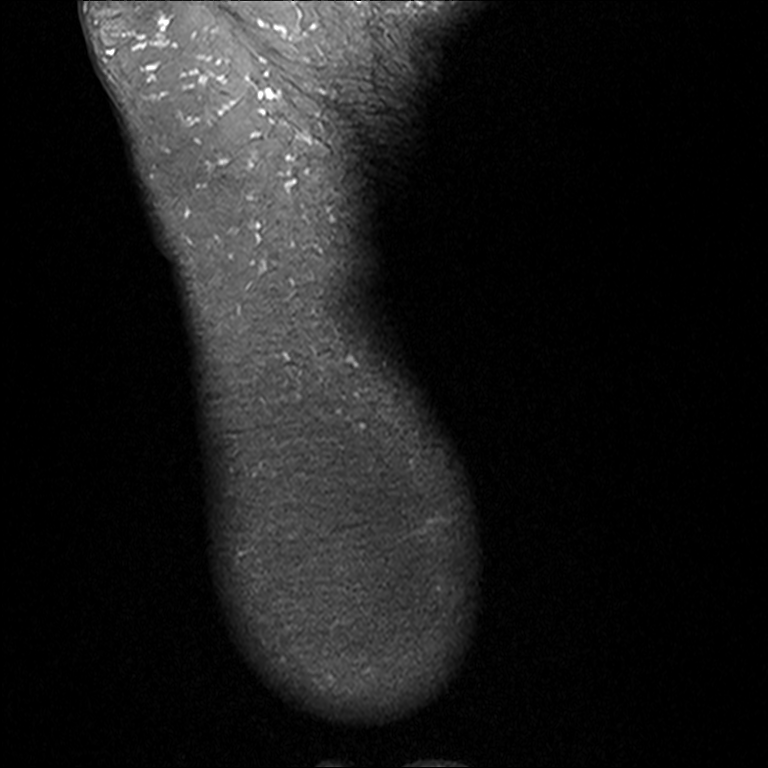
[im 12/20]
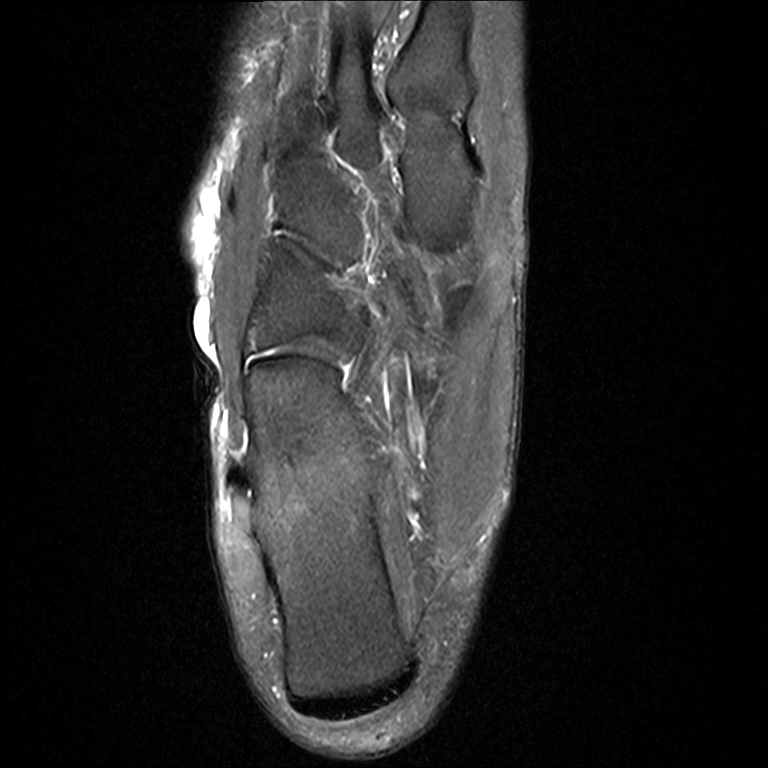

[8 of 40 positions shown; findings below may reference images not displayed]

FINDINGS: TENDONS

Peroneal: Unremarkable

Posteromedial: Distal tibialis posterior tendinopathy

Anterior: Unremarkable

Achilles: Unremarkable

Plantar Fascia: Mildly thickened medial band of the plantar fascia
with edema signal tracking along the margin of the plantar calcaneal
spur and the medial band, images 13-14 series 5.

LIGAMENTS

Lateral: Unremarkable

Medial: Thickened superomedial portion of the spring ligament.

CARTILAGE

Ankle Joint: 5 mm non-fragmented osteochondral injury of the medial
talar dome.

Subtalar Joints/Sinus Tarsi: Unremarkable

Bones: No significant extra-articular osseous abnormalities
identified.

Other: Recurrent 2.5 by 1.1 by 4.7 cm cystic lesion tracking
primarily dorsal lateral and superficial to the extensor digitorum
brevis muscle, favoring ganglion cyst. The location was previously
more inferior-lateral and is currently more dorsal lateral.
IMPRESSION: 1. Recurrent ganglion cyst dorsal laterally along the ankle,
primarily superficial to the extensor digitorum brevis. This was
previously located more inferior -lateral and is currently more
dorsal -lateral.
2. Distal tibialis posterior tendinopathy with adjacent thickening
of the superomedial portion of the spring ligament. Correlate
clinically in assessing for tibialis posterior dysfunction.
3. Mildly thickened medial band of the plantar fascia with a small
amount of edema at the interface between the plantar calcaneal spur
and the medial band, potentially reflecting plantar fasciitis.
4. 5 mm non-fragmented osteochondral injury of the medial talar
dome.

## 2017-06-27 NOTE — Congregational Nurse Program (Signed)
Congregational Nurse Program Note  Date of Encounter: 06/27/2017  Past Medical History: Past Medical History:  Diagnosis Date  . Allergy    SEASONAL  . Bulging lumbar disc   . Depression   . Hyperlipidemia   . Hypertension   . Prediabetes   . Smoker     Encounter Details: CNP Questionnaire - 06/27/17 2222      Questionnaire   Patient Status  Not Applicable    Race  White or Caucasian    Location Patient Served At  [employer]  Not Applicable    Uninsured  Uninsured (NEW 1x/quarter)    Food  No food insecurities    Housing/Utilities  Yes, have permanent housing    Transportation  No transportation needs    Interpersonal Safety  Yes, feel physically and emotionally safe where you currently live    Medication  No medication insecurities    Medical Provider  Yes    Referrals  Other    ED Visit Averted  Yes    Life-Saving Intervention Made  Not Applicable     Seen at the [employer].B/P 164/113- P 84 T[facility] per [facility] SW Screening scheduled for 07-09-17 between 10a and 12n; however closed this Monday due to [facility]. TCT Clara Gunn spoke with [place]. Has appointment tomorrow at 1:30 Scheduled by [place] [facility] [phone]

## 2017-06-28 ENCOUNTER — Emergency Department (HOSPITAL_COMMUNITY): Payer: Self-pay

## 2017-06-28 ENCOUNTER — Other Ambulatory Visit: Payer: Self-pay

## 2017-06-28 ENCOUNTER — Encounter (HOSPITAL_COMMUNITY): Payer: Self-pay | Admitting: *Deleted

## 2017-06-28 ENCOUNTER — Emergency Department (HOSPITAL_COMMUNITY)
Admission: EM | Admit: 2017-06-28 | Discharge: 2017-06-28 | Disposition: A | Discharge status: Home / Self Care | Payer: Self-pay | Attending: Emergency Medicine | Admitting: Emergency Medicine

## 2017-06-28 DIAGNOSIS — I10 Essential (primary) hypertension: Secondary | ICD-10-CM | POA: Insufficient documentation

## 2017-06-28 DIAGNOSIS — Z139 Encounter for screening, unspecified: Secondary | ICD-10-CM

## 2017-06-28 DIAGNOSIS — R51 Headache: Secondary | ICD-10-CM | POA: Insufficient documentation

## 2017-06-28 DIAGNOSIS — Z79899 Other long term (current) drug therapy: Secondary | ICD-10-CM | POA: Insufficient documentation

## 2017-06-28 DIAGNOSIS — F17210 Nicotine dependence, cigarettes, uncomplicated: Secondary | ICD-10-CM | POA: Insufficient documentation

## 2017-06-28 DIAGNOSIS — G44209 Tension-type headache, unspecified, not intractable: Secondary | ICD-10-CM

## 2017-06-28 LAB — CBC
HEMATOCRIT: 51.4 % — AB (ref 36.0–46.0)
HEMOGLOBIN: 17.3 g/dL — AB (ref 12.0–15.0)
MCH: 32.4 pg (ref 26.0–34.0)
MCHC: 33.7 g/dL (ref 30.0–36.0)
MCV: 96.3 fL (ref 78.0–100.0)
Platelets: 180 10*3/uL (ref 150–400)
RBC: 5.34 MIL/uL — ABNORMAL HIGH (ref 3.87–5.11)
RDW: 13.6 % (ref 11.5–15.5)
WBC: 8.6 10*3/uL (ref 4.0–10.5)

## 2017-06-28 LAB — HEPATIC FUNCTION PANEL
ALBUMIN: 4.3 g/dL (ref 3.5–5.0)
ALK PHOS: 78 U/L (ref 38–126)
ALT: 18 U/L (ref 14–54)
AST: 24 U/L (ref 15–41)
Bilirubin, Direct: 0.1 mg/dL (ref 0.1–0.5)
Indirect Bilirubin: 0.7 mg/dL (ref 0.3–0.9)
TOTAL PROTEIN: 7.8 g/dL (ref 6.5–8.1)
Total Bilirubin: 0.8 mg/dL (ref 0.3–1.2)

## 2017-06-28 LAB — HCG, QUANTITATIVE, PREGNANCY: HCG, BETA CHAIN, QUANT, S: 5 m[IU]/mL — AB (ref <5)

## 2017-06-28 LAB — BASIC METABOLIC PANEL
ANION GAP: 9 (ref 5–15)
BUN: 13 mg/dL (ref 6–20)
CO2: 28 mmol/L (ref 22–32)
Calcium: 10.6 mg/dL — ABNORMAL HIGH (ref 8.9–10.3)
Chloride: 102 mmol/L (ref 101–111)
Creatinine, Ser: 0.68 mg/dL (ref 0.44–1.00)
GFR calc Af Amer: >60 mL/min (ref >60)
GFR calc non Af Amer: >60 mL/min (ref >60)
GLUCOSE: 92 mg/dL (ref 65–99)
POTASSIUM: 4.8 mmol/L (ref 3.5–5.1)
Sodium: 139 mmol/L (ref 135–145)

## 2017-06-28 LAB — GLUCOSE, POCT (MANUAL RESULT ENTRY): POC Glucose: 94 mg/dl (ref 70–99)

## 2017-06-28 LAB — I-STAT BETA HCG BLOOD, ED (MC, WL, AP ONLY): I-stat hCG, quantitative: 5.4 m[IU]/mL — ABNORMAL HIGH (ref <5)

## 2017-06-28 LAB — LIPASE, BLOOD: Lipase: 30 U/L (ref 11–51)

## 2017-06-28 LAB — TROPONIN I: Troponin I: <0.03 ng/mL (ref <0.03)

## 2017-06-28 LAB — I-STAT TROPONIN, ED: Troponin i, poc: 0 ng/mL (ref 0.00–0.08)

## 2017-06-28 MED ORDER — DIPHENHYDRAMINE HCL 50 MG/ML IJ SOLN
50.0000 mg | Freq: Once | INTRAMUSCULAR | Status: AC
  Administered 2017-06-28: 50 mg via INTRAVENOUS
  Filled 2017-06-28: qty 1

## 2017-06-28 MED ORDER — METOCLOPRAMIDE HCL 5 MG/ML IJ SOLN
10.0000 mg | Freq: Once | INTRAMUSCULAR | Status: AC
  Administered 2017-06-28: 10 mg via INTRAVENOUS
  Filled 2017-06-28: qty 2

## 2017-06-28 MED ORDER — HYDROCHLOROTHIAZIDE 25 MG PO TABS: 25.0000 mg | ORAL_TABLET | Freq: Every day | ORAL | 1 refills | Status: AC

## 2017-06-28 MED ORDER — SODIUM CHLORIDE 0.9 % IV SOLN
INTRAVENOUS | Status: DC
  Administered 2017-06-28: 17:00:00 via INTRAVENOUS

## 2017-06-28 MED ORDER — SODIUM CHLORIDE 0.9 % IV BOLUS
1000.0000 mL | Freq: Once | INTRAVENOUS | Status: AC
  Administered 2017-06-28: 1000 mL via INTRAVENOUS

## 2017-06-28 MED ORDER — METOCLOPRAMIDE HCL 10 MG PO TABS: 10.0000 mg | ORAL_TABLET | Freq: Once | ORAL | Status: DC

## 2017-06-28 MED ORDER — DEXAMETHASONE SODIUM PHOSPHATE 4 MG/ML IJ SOLN
10.0000 mg | Freq: Once | INTRAMUSCULAR | Status: AC
  Administered 2017-06-28: 10 mg via INTRAVENOUS
  Filled 2017-06-28: qty 3

## 2017-06-28 NOTE — ED Notes (Signed)
Date and time results received: 06/28/17 1727 (use smartphrase ".now" to insert current time)  Test: istat beta HCG Critical Value: 5.4  Name of Provider Notified: Zackowski  Orders Received? Or Actions Taken?: Orders Received - See Orders for details

## 2017-06-28 NOTE — Discharge Instructions (Addendum)
Start the diuretic hydrochlorothiazide for the blood pressure.  Make an appointment to follow-up with your primary care doctor to have prep pressures rechecked.  Would expect headache to continue to improve.  Return for any new or worse symptoms.  CT head was negative lab work today without any significant findings.  Would recommend going home and resting in a quiet environment the long-acting Decadron you were given today should kick in by morning to really help the headache. Based on today's blood pressure readings suspect that you do have some borderline hypertension.  In addition as we discussed your quantitative hCG pregnancy hormone number was borderline elevated.  Need to have this rechecked when you follow-up with your regular doctor.

## 2017-06-28 NOTE — ED Triage Notes (Signed)
Pt c/o high blood pressure with headache, blurred vision and some intermittent chest pain

## 2017-06-28 NOTE — ED Provider Notes (Signed)
[facility] EMERGENCY DEPARTMENT Provider Note   CSN: [id number] Arrival date & time: 06/28/17  1414     History   Chief Complaint Chief Complaint  Patient presents with  . Hypertension    HPI Cassandra Goodman is a 52 y.o. female.  Patient presents with a complaint of high blood pressure and headache and intermittent chest pain.  On Tuesday night patient had a headache all over.  Blurred vision started yesterday evening.  Associated with some light sensitivity.  Patient does not have a true history of migraines.  But has had headaches in the past.  Associated with some chills and some nausea.  Started with some left chest pain last night is been intermittent last for 10 to 15 minutes.  Nonradiating.  Patient in the past has had a history of hypertension has not been on medications blood pressure improved after losing weight.  Patient is followed by [facility] [employer] primary care.     Past Medical History:  Diagnosis Date  . Allergy    SEASONAL  . Bulging lumbar disc   . Depression   . Hyperlipidemia   . Hypertension   . Prediabetes   . Smoker     Patient Active Problem List   Diagnosis Date Noted  . Ganglion cyst of right foot 01/06/2016  . Dyspepsia 02/25/2015  . Colon cancer screening 02/25/2015  . Smoker     Past Surgical History:  Procedure Laterality Date  . ESOPHAGOGASTRODUODENOSCOPY N/A 03/19/2015   Procedure: ESOPHAGOGASTRODUODENOSCOPY (EGD);  Surgeon: Danie Binder, MD;  Location: [facility];  Service: Endoscopy;  Laterality: N/A;  945  . GANGLION CYST EXCISION Right    foot  . LAPAROSCOPIC TUBAL LIGATION    . TUBAL LIGATION       OB History    Gravida  2   Para  2   Term  0   Preterm  0   AB  0   Living  2     SAB  0   TAB  0   Ectopic  0   Multiple  0   Live Births               Home Medications    Prior to Admission medications   Medication Sig Start Date End Date Taking? Authorizing Provider  hydrochlorothiazide  (HYDRODIURIL) 25 MG tablet Take 1 tablet (25 mg total) by mouth daily. 06/28/17   Fredia Sorrow, MD    Family History Family History  Problem Relation Age of Onset  . Depression Mother   . Diabetes Mother   . Cancer Mother        ovarian  . Depression Maternal Grandmother   . Diabetes Maternal Grandmother   . Diabetes Father   . COPD Maternal Grandfather        liver, heavy alcohol  . Cancer Maternal Aunt        breast  . Cancer Maternal Uncle        throat, smoker    Social History Social History   Tobacco Use  . Smoking status: Current Every Day Smoker    Packs/day: 0.50    Years: 20.00    Pack years: 10.00    Types: Cigarettes  . Smokeless tobacco: Former Systems developer    Quit date: 06/11/2014  Substance Use Topics  . Alcohol use: Yes    Alcohol/week: 7.2 oz    Types: 12 Cans of beer per week    Comment: OCCASIONALLY/Weekends  . Drug use:  No     Allergies   Morphine and related   Review of Systems Review of Systems  Constitutional: Positive for chills and fatigue.  HENT: Negative for congestion.   Eyes: Positive for photophobia.  Respiratory: Negative for shortness of breath.   Cardiovascular: Positive for chest pain.  Gastrointestinal: Positive for nausea. Negative for abdominal pain.  Genitourinary: Negative for difficulty urinating and dysuria.  Musculoskeletal: Negative for myalgias.  Skin: Negative for rash.  Neurological: Positive for headaches. Negative for dizziness, seizures, syncope, facial asymmetry, speech difficulty, weakness and numbness.  Hematological: Does not bruise/bleed easily.  Psychiatric/Behavioral: Negative for confusion.     Physical Exam Updated Vital Signs BP (!) 175/105 (BP Location: Right Arm)   Pulse 86   Temp 97.8 F (36.6 C) (Oral)   Resp 20   Ht 1.626 m (5\' 4" )   Wt 77.6 kg (171 lb)   SpO2 92%   BMI 29.35 kg/m   Physical Exam  Constitutional: She is oriented to person, place, and time. She appears well-developed  and well-nourished. No distress.  HENT:  Head: Normocephalic and atraumatic.  Mouth/Throat: Oropharynx is clear and moist.  Eyes: Pupils are equal, round, and reactive to light. Conjunctivae and EOM are normal.  Neck: Neck supple.  Cardiovascular: Normal rate, regular rhythm and normal heart sounds.  Pulmonary/Chest: Effort normal and breath sounds normal. No respiratory distress.  Abdominal: Soft. Bowel sounds are normal. There is no tenderness.  Musculoskeletal: Normal range of motion. She exhibits no edema.  Neurological: She is alert and oriented to person, place, and time. No cranial nerve deficit or sensory deficit. She exhibits normal muscle tone. Coordination normal.  Skin: Skin is warm. Capillary refill takes less than 2 seconds. No rash noted.  Nursing note and vitals reviewed.    ED Treatments / Results  Labs (all labs ordered are listed, but only abnormal results are displayed) Labs Reviewed  BASIC METABOLIC PANEL - Abnormal; Notable for the following components:      Result Value   Calcium 10.6 (*)    All other components within normal limits  CBC - Abnormal; Notable for the following components:   RBC 5.34 (*)    Hemoglobin 17.3 (*)    HCT 51.4 (*)    All other components within normal limits  HCG, QUANTITATIVE, PREGNANCY - Abnormal; Notable for the following components:   hCG, Beta Chain, Quant, S 5 (*)    All other components within normal limits  I-STAT BETA HCG BLOOD, ED ([facility], [facility], [facility] ONLY) - Abnormal; Notable for the following components:   I-stat hCG, quantitative 5.4 (*)    All other components within normal limits  HEPATIC FUNCTION PANEL  LIPASE, BLOOD  TROPONIN I  I-STAT TROPONIN, ED    EKG EKG Interpretation  Date/Time:  Thursday Jun 28 2017 14:39:40 EDT Ventricular Rate:  88 PR Interval:  130 QRS Duration: 96 QT Interval:  378 QTC Calculation: 457 R Axis:   75 Text Interpretation:  Normal sinus rhythm Normal ECG No significant change since  last tracing Confirmed by Merrily Pew [phone]) on 06/28/2017 3:51:23 PM   Radiology Dg Chest 2 View  Result Date: 06/28/2017 CLINICAL DATA:  Chest pain and hypertension.  Blurred vision. EXAM: CHEST - 2 VIEW COMPARISON:  None. FINDINGS: Lungs are clear. Heart size and pulmonary vascularity are normal. No adenopathy. There is mild degenerative change in the thoracic spine. No pneumothorax. IMPRESSION: No edema or consolidation. Electronically Signed   By: Lowella Grip III M.D.  On: 06/28/2017 15:08   Ct Head Wo Contrast  Result Date: 06/28/2017 CLINICAL DATA:  Headache with high blood pressure and blurry vision EXAM: CT HEAD WITHOUT CONTRAST TECHNIQUE: Contiguous axial images were obtained from the base of the skull through the vertex without intravenous contrast. COMPARISON:  MRI 09/10/2012, CT brain 09/10/2012 FINDINGS: Brain: No evidence of acute infarction, hemorrhage, hydrocephalus, extra-axial collection or mass lesion/mass effect. Vascular: No hyperdense vessels. Scattered calcifications at the carotid siphons Skull: Normal. Negative for fracture or focal lesion. Sinuses/Orbits: No acute finding. Other: None IMPRESSION: Negative non contrasted CT appearance of the brain Electronically Signed   By: Donavan Foil M.D.   On: 06/28/2017 18:54    Procedures Procedures (including critical care time)  Medications Ordered in ED Medications  0.9 %  sodium chloride infusion ( Intravenous Stopped 06/28/17 1932)  sodium chloride 0.9 % bolus 1,000 mL (0 mLs Intravenous Stopped 06/28/17 1718)  diphenhydrAMINE (BENADRYL) injection 50 mg (50 mg Intravenous Given 06/28/17 1648)  dexamethasone (DECADRON) injection 10 mg (10 mg Intravenous Given 06/28/17 1648)  metoCLOPramide (REGLAN) injection 10 mg (10 mg Intravenous Given 06/28/17 1647)     Initial Impression / Assessment and Plan / ED Course  I have reviewed the triage vital signs and the nursing notes.  Pertinent labs & imaging results that  were available during my care of the patient were reviewed by me and considered in my medical decision making (see chart for details).     Patient treated with migraine cocktail with significant improvement in the headache.  Blood pressure also improved with headache improvement however still mildly elevated with systolic pressures above 294.  Diastolic pressures below 90.  Suspect patient has some borderline hypertension.  Head CT without any acute findings.  Labs without significant abnormalities with one exception.  Patient had an i-STAT hCG that was slightly elevated.  Quantitative hCG came in at 5 hello 5 is normal.  Clinically unlikely that patient is pregnant however not completely ruled out.  Also not completely ruled out the patient may have an hCG producing tumor.  All this discussed with patient.  She will follow-up with her primary care doctor for blood pressure rechecks.  Patient will be started on hydrochlorothiazide here today.  And patient will also have her hCG rechecked by her primary care doctor.  Do not feel that headache is related to anything significant particularly in the face of the negative CT of the head.  Patient was significant improvement with the migraine cocktail.  Final Clinical Impressions(s) / ED Diagnoses   Final diagnoses:  Essential hypertension    ED Discharge Orders        Ordered    hydrochlorothiazide (HYDRODIURIL) 25 MG tablet  Daily     06/28/17 2027       Fredia Sorrow, MD 06/28/17 2032

## 2017-06-28 NOTE — Congregational Nurse Program (Signed)
Pt is a 52 year old caucasian female, who lives in [place] and was here at the [facility] in [place] due to high blood pressure.   Pt vitals were 160/112  Pulse:91 Temp: 98.1/oral  Glucose:94 Ht: 5'4 Wt: 172.2 lb  After obtaining pts vitals I started to go over the reason for her visit in which she said 'high blood pressure". Pt appeared tired and so I continued to ask if she is experiencing any headaches in which she said yes for about 2 weeks, asked about  Numbness in which she said no, asked about flashing lights in which she said no and asked about any chest pain in which she said "yes but its not massive, I'm not concerned about it". Due to her answer I stopped asking questions and alerted the RN. RN evaluated her and recommended her got to the ER due to the fact that there isn't a MD here at the [facility] and we aren't able to run any major labs. RN asked if we could call an ambulance in which pt refused saying "ER is not far, I can make it". Will call pt to see if she made it to the ER.   Kennidi Yoshida R. [place], LPN [phone]

## 2017-07-04 ENCOUNTER — Telehealth: Payer: Self-pay

## 2017-07-04 NOTE — Telephone Encounter (Signed)
Pt was here at the [facility] on 06/28/17 with complaints of high blood pressure. During intake pt stated she had some chest pain, which then the RN assessed her and referred her into the hospital in which the pt agreed to go and stated she could drive herself there.   Pt was called today 07/04/17 to the number she provided [phone] for follow-up and too see if she would like to come back in to our office so she can get help navigating into a primary care provided. No answer. Left message.     Adrean Findlay R.[place], LPN [phone]

## 2017-08-14 ENCOUNTER — Other Ambulatory Visit (HOSPITAL_COMMUNITY): Payer: Self-pay | Admitting: *Deleted

## 2017-08-14 DIAGNOSIS — Z1231 Encounter for screening mammogram for malignant neoplasm of breast: Secondary | ICD-10-CM

## 2017-08-27 ENCOUNTER — Ambulatory Visit (HOSPITAL_COMMUNITY)
Admission: RE | Admit: 2017-08-27 | Discharge: 2017-08-27 | Disposition: A | Discharge status: Home / Self Care | Payer: PRIVATE HEALTH INSURANCE | Source: Ambulatory Visit | Attending: *Deleted | Admitting: *Deleted

## 2017-08-27 DIAGNOSIS — Z1231 Encounter for screening mammogram for malignant neoplasm of breast: Secondary | ICD-10-CM | POA: Insufficient documentation

## 2017-09-06 ENCOUNTER — Other Ambulatory Visit: Payer: Self-pay

## 2017-09-06 ENCOUNTER — Encounter (HOSPITAL_COMMUNITY): Payer: Self-pay

## 2017-09-06 ENCOUNTER — Emergency Department (HOSPITAL_COMMUNITY)
Admission: EM | Admit: 2017-09-06 | Discharge: 2017-09-06 | Disposition: A | Discharge status: Home / Self Care | Payer: Self-pay | Attending: Emergency Medicine | Admitting: Emergency Medicine

## 2017-09-06 ENCOUNTER — Emergency Department (HOSPITAL_COMMUNITY): Payer: Self-pay

## 2017-09-06 DIAGNOSIS — I10 Essential (primary) hypertension: Secondary | ICD-10-CM | POA: Insufficient documentation

## 2017-09-06 DIAGNOSIS — Z79899 Other long term (current) drug therapy: Secondary | ICD-10-CM | POA: Insufficient documentation

## 2017-09-06 DIAGNOSIS — E785 Hyperlipidemia, unspecified: Secondary | ICD-10-CM | POA: Insufficient documentation

## 2017-09-06 DIAGNOSIS — K5732 Diverticulitis of large intestine without perforation or abscess without bleeding: Secondary | ICD-10-CM | POA: Insufficient documentation

## 2017-09-06 DIAGNOSIS — F17210 Nicotine dependence, cigarettes, uncomplicated: Secondary | ICD-10-CM | POA: Insufficient documentation

## 2017-09-06 LAB — URINALYSIS, ROUTINE W REFLEX MICROSCOPIC
BACTERIA UA: NONE SEEN
Bilirubin Urine: NEGATIVE
Glucose, UA: NEGATIVE mg/dL
Ketones, ur: NEGATIVE mg/dL
NITRITE: NEGATIVE
PH: 6 (ref 5.0–8.0)
Protein, ur: NEGATIVE mg/dL
SPECIFIC GRAVITY, URINE: 1.036 — AB (ref 1.005–1.030)

## 2017-09-06 LAB — RAPID URINE DRUG SCREEN, HOSP PERFORMED
AMPHETAMINES: NOT DETECTED
BARBITURATES: NOT DETECTED
Benzodiazepines: NOT DETECTED
COCAINE: NOT DETECTED
OPIATES: NOT DETECTED
Tetrahydrocannabinol: POSITIVE — AB

## 2017-09-06 LAB — CBC WITH DIFFERENTIAL/PLATELET
BASOS ABS: 0 10*3/uL (ref 0.0–0.1)
BASOS PCT: 0 %
EOS ABS: 0.1 10*3/uL (ref 0.0–0.7)
Eosinophils Relative: 1 %
HEMATOCRIT: 48.3 % — AB (ref 36.0–46.0)
HEMOGLOBIN: 16.1 g/dL — AB (ref 12.0–15.0)
Lymphocytes Relative: 13 %
Lymphs Abs: 1.3 10*3/uL (ref 0.7–4.0)
MCH: 32.2 pg (ref 26.0–34.0)
MCHC: 33.3 g/dL (ref 30.0–36.0)
MCV: 96.6 fL (ref 78.0–100.0)
MONOS PCT: 7 %
Monocytes Absolute: 0.7 10*3/uL (ref 0.1–1.0)
NEUTROS ABS: 8 10*3/uL — AB (ref 1.7–7.7)
NEUTROS PCT: 79 %
Platelets: 165 10*3/uL (ref 150–400)
RBC: 5 MIL/uL (ref 3.87–5.11)
RDW: 13.3 % (ref 11.5–15.5)
WBC: 10.1 10*3/uL (ref 4.0–10.5)

## 2017-09-06 LAB — COMPREHENSIVE METABOLIC PANEL
ALBUMIN: 3.8 g/dL (ref 3.5–5.0)
ALT: 15 U/L (ref 0–44)
ANION GAP: 7 (ref 5–15)
AST: 18 U/L (ref 15–41)
Alkaline Phosphatase: 65 U/L (ref 38–126)
BUN: 13 mg/dL (ref 6–20)
CHLORIDE: 106 mmol/L (ref 98–111)
CO2: 27 mmol/L (ref 22–32)
Calcium: 9.8 mg/dL (ref 8.9–10.3)
Creatinine, Ser: 0.71 mg/dL (ref 0.44–1.00)
GFR calc Af Amer: >60 mL/min (ref >60)
Glucose, Bld: 120 mg/dL — ABNORMAL HIGH (ref 70–99)
Potassium: 4.1 mmol/L (ref 3.5–5.1)
Sodium: 140 mmol/L (ref 135–145)
TOTAL PROTEIN: 7.4 g/dL (ref 6.5–8.1)
Total Bilirubin: 0.7 mg/dL (ref 0.3–1.2)

## 2017-09-06 LAB — LIPASE, BLOOD: LIPASE: 34 U/L (ref 11–51)

## 2017-09-06 LAB — ETHANOL

## 2017-09-06 MED ORDER — IOPAMIDOL (ISOVUE-300) INJECTION 61%
100.0000 mL | Freq: Once | INTRAVENOUS | Status: AC | PRN
  Administered 2017-09-06: 100 mL via INTRAVENOUS

## 2017-09-06 MED ORDER — SODIUM CHLORIDE 0.9 % IV BOLUS
1000.0000 mL | Freq: Once | INTRAVENOUS | Status: AC
  Administered 2017-09-06: 1000 mL via INTRAVENOUS

## 2017-09-06 MED ORDER — ONDANSETRON HCL 4 MG PO TABS: 4.0000 mg | ORAL_TABLET | Freq: Three times a day (TID) | ORAL | 0 refills | Status: DC | PRN

## 2017-09-06 MED ORDER — ONDANSETRON HCL 4 MG/2ML IJ SOLN
4.0000 mg | Freq: Once | INTRAMUSCULAR | Status: AC
  Administered 2017-09-06: 4 mg via INTRAVENOUS
  Filled 2017-09-06: qty 2

## 2017-09-06 MED ORDER — HYDROCODONE-ACETAMINOPHEN 5-325 MG PO TABS: 1.0000 | ORAL_TABLET | Freq: Four times a day (QID) | ORAL | 0 refills | Status: DC | PRN

## 2017-09-06 MED ORDER — CIPROFLOXACIN IN D5W 400 MG/200ML IV SOLN
400.0000 mg | Freq: Once | INTRAVENOUS | Status: AC
  Administered 2017-09-06: 400 mg via INTRAVENOUS
  Filled 2017-09-06: qty 200

## 2017-09-06 MED ORDER — FENTANYL CITRATE (PF) 100 MCG/2ML IJ SOLN
50.0000 ug | Freq: Once | INTRAMUSCULAR | Status: AC
Start: 2017-09-06 — End: 2017-09-06
  Administered 2017-09-06: 50 ug via INTRAVENOUS
  Filled 2017-09-06: qty 2

## 2017-09-06 MED ORDER — KETOROLAC TROMETHAMINE 30 MG/ML IJ SOLN
30.0000 mg | Freq: Once | INTRAMUSCULAR | Status: AC
  Administered 2017-09-06: 30 mg via INTRAVENOUS
  Filled 2017-09-06: qty 1

## 2017-09-06 MED ORDER — METRONIDAZOLE IN NACL 5-0.79 MG/ML-% IV SOLN
500.0000 mg | Freq: Once | INTRAVENOUS | Status: AC
  Administered 2017-09-06: 500 mg via INTRAVENOUS
  Filled 2017-09-06: qty 100

## 2017-09-06 MED ORDER — SODIUM CHLORIDE 0.9 % IV BOLUS
500.0000 mL | Freq: Once | INTRAVENOUS | Status: AC
  Administered 2017-09-06: 500 mL via INTRAVENOUS

## 2017-09-06 MED ORDER — CIPROFLOXACIN HCL 500 MG PO TABS: 500.0000 mg | ORAL_TABLET | Freq: Two times a day (BID) | ORAL | 0 refills | Status: DC

## 2017-09-06 MED ORDER — METRONIDAZOLE 500 MG PO TABS: 500.0000 mg | ORAL_TABLET | Freq: Three times a day (TID) | ORAL | 0 refills | Status: DC

## 2017-09-06 NOTE — Discharge Instructions (Addendum)
Drink plenty of fluids (clear liquids) for the next 1-2 days, then start a bland diet after that if doing well such as toast, crackers, jello, [employer] chicken noodle soup. Slowly increase your diet back to normal. Avoid fried, spicy or greasy foods. Use the zofran for nausea or vomiting. Take the antibiotics until gone. Take the hydrocodone for pain as needed.  Return to the ED if you get a high fever, have uncontrolled vomiting, or worsening pain.  Please call Dr. Samella Parr office to let him know about your ED visit.  He may want to see you in the office sometime next week.

## 2017-09-06 NOTE — ED Provider Notes (Signed)
[facility] EMERGENCY DEPARTMENT Provider Note   CSN: [id number] Arrival date & time: 09/06/17  0330  Time seen 04:00 AM   History   Chief Complaint Chief Complaint  Patient presents with  . Abdominal Pain    HPI Cassandra Goodman is a 52 y.o. female.  HPI patient states she has been having left lower quadrant pain off and on since May.  She states that will come and go for a few days and then go away.  The pain is usually in the left lower quadrant.  She describes the pain as sharp.  She states nothing she does makes it worse except coughing, nothing makes it feel better.  She has nausea without vomiting, or diarrhea.  She denies fever, dysuria, hematuria, or frequency.  She states she has not had a period in 7 years since she had a uterine ablation done at [facility].  She states she was seen here in May and was told she either was pregnant or she had some type of cancer.  She has been going to the health department and states they have done several test on her that were normal.  She started hydrochlorothiazide on July 26 for hypertension.   PCP Susy Frizzle, MD   Past Medical History:  Diagnosis Date  . Allergy    SEASONAL  . Bulging lumbar disc   . Depression   . Hyperlipidemia   . Hypertension   . Prediabetes   . Smoker     Patient Active Problem List   Diagnosis Date Noted  . Ganglion cyst of right foot 01/06/2016  . Dyspepsia 02/25/2015  . Colon cancer screening 02/25/2015  . Smoker     Past Surgical History:  Procedure Laterality Date  . ESOPHAGOGASTRODUODENOSCOPY N/A 03/19/2015   Procedure: ESOPHAGOGASTRODUODENOSCOPY (EGD);  Surgeon: Danie Binder, MD;  [facility];  Service: Endoscopy;  Laterality: N/A;  945  . GANGLION CYST EXCISION Right    foot  . LAPAROSCOPIC TUBAL LIGATION    . TUBAL LIGATION       OB History    Gravida  2   Para  2   Term  0   Preterm  0   AB  0   Living  2     SAB  0   TAB  0   Ectopic  0   Multiple  0   Live Births               Home Medications    Prior to Admission medications   Medication Sig Start Date End Date Taking? Authorizing Provider  ciprofloxacin (CIPRO) 500 MG tablet Take 1 tablet (500 mg total) by mouth 2 (two) times daily. 09/06/17   Rolland Porter, MD  hydrochlorothiazide (HYDRODIURIL) 25 MG tablet Take 1 tablet (25 mg total) by mouth daily. 06/28/17   Fredia Sorrow, MD  HYDROcodone-acetaminophen (NORCO/VICODIN) 5-325 MG tablet Take 1 tablet by mouth every 6 (six) hours as needed. 09/06/17   Rolland Porter, MD  metroNIDAZOLE (FLAGYL) 500 MG tablet Take 1 tablet (500 mg total) by mouth 3 (three) times daily. 09/06/17   Rolland Porter, MD  ondansetron (ZOFRAN) 4 MG tablet Take 1 tablet (4 mg total) by mouth every 8 (eight) hours as needed for nausea or vomiting. 09/06/17   Rolland Porter, MD    Family History Family History  Problem Relation Age of Onset  . Depression Mother   . Diabetes Mother   . Cancer Mother  ovarian  . Depression Maternal Grandmother   . Diabetes Maternal Grandmother   . Diabetes Father   . COPD Maternal Grandfather        liver, heavy alcohol  . Cancer Maternal Aunt        breast  . Cancer Maternal Uncle        throat, smoker    Social History Social History   Tobacco Use  . Smoking status: Current Every Day Smoker    Packs/day: 0.50    Years: 20.00    Pack years: 10.00    Types: Cigarettes  . Smokeless tobacco: Former Systems developer    Quit date: 06/11/2014  Substance Use Topics  . Alcohol use: Yes    Alcohol/week: 7.2 oz    Types: 12 Cans of beer per week    Comment: OCCASIONALLY/Weekends  . Drug use: No  employed   Allergies   Morphine and related   Review of Systems Review of Systems  All other systems reviewed and are negative.    Physical Exam Updated Vital Signs BP (!) 151/97 (BP Location: Left Arm)   Pulse 95   Temp 98.4 F (36.9 C) (Oral)   Resp 20   Ht 5\' 4"  (1.626 m)   Wt 78 kg (172 lb)   SpO2 100%   BMI  29.52 kg/m   Vital signs normal except hypertension   Physical Exam  Constitutional: She is oriented to person, place, and time. She appears well-developed and well-nourished.  Non-toxic appearance. She does not appear ill. No distress.  HENT:  Head: Normocephalic and atraumatic.  Right Ear: External ear normal.  Left Ear: External ear normal.  Nose: Nose normal. No mucosal edema or rhinorrhea.  Mouth/Throat: Oropharynx is clear and moist and mucous membranes are normal. No dental abscesses or uvula swelling.  Eyes: Pupils are equal, round, and reactive to light. Conjunctivae and EOM are normal.  Neck: Normal range of motion and full passive range of motion without pain. Neck supple.  Cardiovascular: Normal rate, regular rhythm and normal heart sounds. Exam reveals no gallop and no friction rub.  No murmur heard. Pulmonary/Chest: Effort normal and breath sounds normal. No respiratory distress. She has no wheezes. She has no rhonchi. She has no rales. She exhibits no tenderness and no crepitus.  Abdominal: Soft. Normal appearance and bowel sounds are normal. She exhibits no distension. There is generalized tenderness. There is no rebound and no guarding.  Musculoskeletal: Normal range of motion. She exhibits no edema or tenderness.  Moves all extremities well.   Neurological: She is alert and oriented to person, place, and time. She has normal strength. No cranial nerve deficit.  Skin: Skin is warm, dry and intact. No rash noted. No erythema. No pallor.  Psychiatric: She has a normal mood and affect. Her speech is normal and behavior is normal. Her mood appears not anxious.  Nursing note and vitals reviewed.    ED Treatments / Results  Labs (all labs ordered are listed, but only abnormal results are displayed) Results for orders placed or performed during the hospital encounter of 09/06/17  Comprehensive metabolic panel  Result Value Ref Range   Sodium 140 135 - 145 mmol/L    Potassium 4.1 3.5 - 5.1 mmol/L   Chloride 106 98 - 111 mmol/L   CO2 27 22 - 32 mmol/L   Glucose, Bld 120 (H) 70 - 99 mg/dL   BUN 13 6 - 20 mg/dL   Creatinine, Ser 0.71 0.44 - 1.00 mg/dL  Calcium 9.8 8.9 - 10.3 mg/dL   Total Protein 7.4 6.5 - 8.1 g/dL   Albumin 3.8 3.5 - 5.0 g/dL   AST 18 15 - 41 U/L   ALT 15 0 - 44 U/L   Alkaline Phosphatase 65 38 - 126 U/L   Total Bilirubin 0.7 0.3 - 1.2 mg/dL   GFR calc non Af Amer >60 >60 mL/min   GFR calc Af Amer >60 >60 mL/min   Anion gap 7 5 - 15  Ethanol  Result Value Ref Range   Alcohol, Ethyl (B) <10 <10 mg/dL  Lipase, blood  Result Value Ref Range   Lipase 34 11 - 51 U/L  CBC with Differential  Result Value Ref Range   WBC 10.1 4.0 - 10.5 K/uL   RBC 5.00 3.87 - 5.11 MIL/uL   Hemoglobin 16.1 (H) 12.0 - 15.0 g/dL   HCT 48.3 (H) 36.0 - 46.0 %   MCV 96.6 78.0 - 100.0 fL   MCH 32.2 26.0 - 34.0 pg   MCHC 33.3 30.0 - 36.0 g/dL   RDW 13.3 11.5 - 15.5 %   Platelets 165 150 - 400 K/uL   Neutrophils Relative % 79 %   Neutro Abs 8.0 (H) 1.7 - 7.7 K/uL   Lymphocytes Relative 13 %   Lymphs Abs 1.3 0.7 - 4.0 K/uL   Monocytes Relative 7 %   Monocytes Absolute 0.7 0.1 - 1.0 K/uL   Eosinophils Relative 1 %   Eosinophils Absolute 0.1 0.0 - 0.7 K/uL   Basophils Relative 0 %   Basophils Absolute 0.0 0.0 - 0.1 K/uL  Urinalysis, Routine w reflex microscopic  Result Value Ref Range   Color, Urine YELLOW YELLOW   APPearance CLEAR CLEAR   Specific Gravity, Urine 1.036 (H) 1.005 - 1.030   pH 6.0 5.0 - 8.0   Glucose, UA NEGATIVE NEGATIVE mg/dL   Hgb urine dipstick SMALL (A) NEGATIVE   Bilirubin Urine NEGATIVE NEGATIVE   Ketones, ur NEGATIVE NEGATIVE mg/dL   Protein, ur NEGATIVE NEGATIVE mg/dL   Nitrite NEGATIVE NEGATIVE   Leukocytes, UA TRACE (A) NEGATIVE   RBC / HPF 0-5 0 - 5 RBC/hpf   WBC, UA 0-5 0 - 5 WBC/hpf   Bacteria, UA NONE SEEN NONE SEEN   Squamous Epithelial / LPF 0-5 0 - 5   Mucus PRESENT   Urine rapid drug screen (hosp  performed)  Result Value Ref Range   Opiates NONE DETECTED NONE DETECTED   Cocaine NONE DETECTED NONE DETECTED   Benzodiazepines NONE DETECTED NONE DETECTED   Amphetamines NONE DETECTED NONE DETECTED   Tetrahydrocannabinol POSITIVE (A) NONE DETECTED   Barbiturates NONE DETECTED NONE DETECTED    Laboratory interpretation all normal except mildly elevated hemoglobin, it has been higher before and, consistent with dehydration, concentrated urine consistent with dehydration, positive UDS for marijuana   EKG None  Radiology Ct Abdomen Pelvis W Contrast  Result Date: 09/06/2017 CLINICAL DATA:  Acute lower abdominal pain. EXAM: CT ABDOMEN AND PELVIS WITH CONTRAST TECHNIQUE: Multidetector CT imaging of the abdomen and pelvis was performed using the standard protocol following bolus administration of intravenous contrast. CONTRAST:  110mL ISOVUE-300 IOPAMIDOL (ISOVUE-300) INJECTION 61% COMPARISON:  None. FINDINGS: Lower chest: Minimal dependent atelectasis. No consolidation or pleural fluid. Mild breathing motion artifact. Hepatobiliary: Tiny hepatic hypodensities too small to accurately characterize. No suspicious hepatic lesion. Phrygian cap in the gallbladder. Gallbladder physiologically distended, no calcified stone. No biliary dilatation. Pancreas: No ductal dilatation or inflammation. Spleen: Normal in size  without focal abnormality. Adrenals/Urinary Tract: Normal adrenal glands. No hydronephrosis or perinephric edema. Homogeneous renal enhancement with symmetric excretion on delayed phase imaging. Subcentimeter cyst in the medial right kidney. Urinary bladder is physiologically distended without wall thickening. Stomach/Bowel: Inflamed diverticulum in the mid sigmoid colon with pericolonic edema and associated colonic wall thickening consistent with diverticulitis. Small amount of adjacent free fluid but no perforation or abscess. Normal appendix. Mild fecalization of small bowel contents in the  lower abdomen likely reactive. No obstruction. Stomach is nondistended. Vascular/Lymphatic: Mild aortic atherosclerosis. No aneurysm. Retroaortic left renal vein. No adenopathy. Reproductive: Uterus and bilateral adnexa are unremarkable. Other: Small amount of free fluid in the pelvis related to diverticulitis. No abscess or perforation. No free air. Tiny fat containing umbilical hernia. Musculoskeletal: There are no acute or suspicious osseous abnormalities. IMPRESSION: 1. Acute uncomplicated sigmoid diverticulitis without perforation or abscess. 2.  Aortic Atherosclerosis (ICD10-I70.0). Electronically Signed   By: Jeb Levering M.D.   On: 09/06/2017 06:04    Procedures Procedures (including critical care time)  Medications Ordered in ED Medications  sodium chloride 0.9 % bolus 1,000 mL (has no administration in time range)  sodium chloride 0.9 % bolus 500 mL (has no administration in time range)  ciprofloxacin (CIPRO) IVPB 400 mg (has no administration in time range)  metroNIDAZOLE (FLAGYL) IVPB 500 mg (has no administration in time range)  sodium chloride 0.9 % bolus 1,000 mL (1,000 mLs Intravenous New Bag/Given 09/06/17 0438)  sodium chloride 0.9 % bolus 500 mL (500 mLs Intravenous New Bag/Given 09/06/17 0438)  ondansetron (ZOFRAN) injection 4 mg (4 mg Intravenous Given 09/06/17 0436)  ketorolac (TORADOL) 30 MG/ML injection 30 mg (30 mg Intravenous Given 09/06/17 0436)  iopamidol (ISOVUE-300) 61 % injection 100 mL (100 mLs Intravenous Contrast Given 09/06/17 0516)  fentaNYL (SUBLIMAZE) injection 50 mcg (50 mcg Intravenous Given 09/06/17 0604)     Initial Impression / Assessment and Plan / ED Course  I have reviewed the triage vital signs and the nursing notes.  Pertinent labs & imaging results that were available during my care of the patient were reviewed by me and considered in my medical decision making (see chart for details).  Patient was given IV pain medication and nausea medicine.  CT  scan of the abdomen was done to evaluate her left lower quadrant pain.  Patient feels like is her ovary, however she could have diverticulitis or some other type of colitis in that area.    05:50AM I had already reviewed patient's CT scan.  I went into the room and talk to patient about her laboratory tests which were all normal except for a mildly elevated hemoglobin consistent with dehydration.  We reviewed her x-ray and there does appear to be some inflammatory changes in her left lower quadrant.  I was going to go ahead and start her on antibiotics but thought he should wait for the official radiology reading.  I cannot tell if its diverticulitis, or something going on with her pelvic organs.  Patient was given fentanyl for pain.  She states her pain is improved after the toradol but still present.  6:10 AM the CT has been resulted, I discussed the results with the patient which shows diverticulitis.  Explained what that was.  She was started on IV Cipro and Flagyl.  She has not had any vomiting so she should be able to take oral antibiotics at home and hopefully do well.  We talked about reasons to return such as high fever,  uncontrolled vomiting, or worsening pain.  We also discussed that the diet is different when you are having the acute diverticulitis as opposed to when you are not having an infection.  She should do a high-fiber diet when she gets improved.  Review of the [place] shows patient gets #30 alprazolam 1 mg tablets monthly from her primary care doctor, last filled May 31.  Final Clinical Impressions(s) / ED Diagnoses   Final diagnoses:  Diverticulitis of sigmoid colon    ED Discharge Orders        Ordered    ciprofloxacin (CIPRO) 500 MG tablet  2 times daily     09/06/17 0621    metroNIDAZOLE (FLAGYL) 500 MG tablet  3 times daily     09/06/17 0621    ondansetron (ZOFRAN) 4 MG tablet  Every 8 hours PRN     09/06/17 0621    HYDROcodone-acetaminophen  (NORCO/VICODIN) 5-325 MG tablet  Every 6 hours PRN     09/06/17 8185      Plan discharge  Rolland Porter, MD, Barbette Or, MD 09/06/17 [phone]

## 2017-09-06 NOTE — ED Triage Notes (Signed)
Pt c/o pain to lower abd, worse on the left.  Pt denies vomiting or diarrhea.

## 2017-12-06 ENCOUNTER — Other Ambulatory Visit: Payer: Self-pay

## 2017-12-06 ENCOUNTER — Encounter (HOSPITAL_COMMUNITY): Payer: Self-pay

## 2017-12-06 ENCOUNTER — Emergency Department (HOSPITAL_COMMUNITY)
Admission: EM | Admit: 2017-12-06 | Discharge: 2017-12-06 | Disposition: A | Discharge status: Home / Self Care | Payer: Medicaid Other | Attending: Emergency Medicine | Admitting: Emergency Medicine

## 2017-12-06 DIAGNOSIS — H00016 Hordeolum externum left eye, unspecified eyelid: Secondary | ICD-10-CM

## 2017-12-06 DIAGNOSIS — Z79899 Other long term (current) drug therapy: Secondary | ICD-10-CM | POA: Insufficient documentation

## 2017-12-06 DIAGNOSIS — E785 Hyperlipidemia, unspecified: Secondary | ICD-10-CM | POA: Insufficient documentation

## 2017-12-06 DIAGNOSIS — H00015 Hordeolum externum left lower eyelid: Secondary | ICD-10-CM | POA: Insufficient documentation

## 2017-12-06 DIAGNOSIS — H00014 Hordeolum externum left upper eyelid: Secondary | ICD-10-CM | POA: Insufficient documentation

## 2017-12-06 DIAGNOSIS — F17210 Nicotine dependence, cigarettes, uncomplicated: Secondary | ICD-10-CM | POA: Insufficient documentation

## 2017-12-06 DIAGNOSIS — I10 Essential (primary) hypertension: Secondary | ICD-10-CM | POA: Insufficient documentation

## 2017-12-06 MED ORDER — ERYTHROMYCIN 5 MG/GM OP OINT: TOPICAL_OINTMENT | OPHTHALMIC | 0 refills | Status: AC

## 2017-12-06 NOTE — ED Triage Notes (Signed)
Pt reports possible stye to left eye for one week. Pt has swelling to right lid and noted to stye to left lower eye. Reports burning and itching

## 2017-12-06 NOTE — Discharge Instructions (Signed)
Continue with warm compresses to the left eye for 20 minutes at a time peer Apply antibiotic ointment as prescribed. Follow-up with ophthalmology, referral given. Return to ER for worsening or concerning symptoms.

## 2017-12-06 NOTE — ED Provider Notes (Signed)
[facility] EMERGENCY DEPARTMENT Provider Note   CSN: [id number] Arrival date & time: 12/06/17  1104     History   Chief Complaint Chief Complaint  Patient presents with  . Eye Pain    HPI Sacha Topor Feely is a 52 y.o. female.  52 year old female presents with complaint of left eye redness and irritation of her eyelids.  Patient states that she noticed her eye was itchy and irritated on Sunday, noticed swelling of the upper and lower eyelids on Monday.  Patient has been applying compresses without improvement.  Patient does not wear glasses or contacts, denies pain in the eye or visual changes.  Reports occasional discharge from the eye, described as white.  No other injuries, complaints, concerns.     Past Medical History:  Diagnosis Date  . Allergy    SEASONAL  . Bulging lumbar disc   . Depression   . Hyperlipidemia   . Hypertension   . Prediabetes   . Smoker     Patient Active Problem List   Diagnosis Date Noted  . Ganglion cyst of right foot 01/06/2016  . Dyspepsia 02/25/2015  . Colon cancer screening 02/25/2015  . Smoker     Past Surgical History:  Procedure Laterality Date  . ESOPHAGOGASTRODUODENOSCOPY N/A 03/19/2015   Procedure: ESOPHAGOGASTRODUODENOSCOPY (EGD);  Surgeon: Danie Binder, MD;  Location: [facility];  Service: Endoscopy;  Laterality: N/A;  945  . GANGLION CYST EXCISION Right    foot  . LAPAROSCOPIC TUBAL LIGATION    . TUBAL LIGATION       OB History    Gravida  2   Para  2   Term  0   Preterm  0   AB  0   Living  2     SAB  0   TAB  0   Ectopic  0   Multiple  0   Live Births               Home Medications    Prior to Admission medications   Medication Sig Start Date End Date Taking? Authorizing Provider  erythromycin ophthalmic ointment Place a 1/2 inch ribbon of ointment into the lower eyelid. 12/06/17   Tacy Learn, PA-C  hydrochlorothiazide (HYDRODIURIL) 25 MG tablet Take 1 tablet (25 mg total) by  mouth daily. 06/28/17   Fredia Sorrow, MD    Family History Family History  Problem Relation Age of Onset  . Depression Mother   . Diabetes Mother   . Cancer Mother        ovarian  . Depression Maternal Grandmother   . Diabetes Maternal Grandmother   . Diabetes Father   . COPD Maternal Grandfather        liver, heavy alcohol  . Cancer Maternal Aunt        breast  . Cancer Maternal Uncle        throat, smoker    Social History Social History   Tobacco Use  . Smoking status: Current Every Day Smoker    Packs/day: 0.50    Years: 20.00    Pack years: 10.00    Types: Cigarettes  . Smokeless tobacco: Former Systems developer    Quit date: 06/11/2014  Substance Use Topics  . Alcohol use: Not Currently    Alcohol/week: 12.0 standard drinks    Types: 12 Cans of beer per week    Comment: OCCASIONALLY/Weekends  . Drug use: No     Allergies   Morphine and related  Review of Systems Review of Systems  Constitutional: Negative for fever.  HENT: Negative for congestion, sinus pressure and sinus pain.   Eyes: Positive for discharge and itching. Negative for photophobia, redness and visual disturbance.  Respiratory: Negative for cough.   Skin: Negative for wound.  Allergic/Immunologic: Negative for immunocompromised state.  Hematological: Negative for adenopathy. Does not bruise/bleed easily.  All other systems reviewed and are negative.    Physical Exam Updated Vital Signs BP 116/89 (BP Location: Right Arm)   Pulse 87   Temp 98.8 F (37.1 C) (Oral)   Resp 17   Ht 5\' 4"  (1.626 m)   Wt 80.3 kg   SpO2 98%   BMI 30.38 kg/m   Physical Exam  Constitutional: She is oriented to person, place, and time. She appears well-developed and well-nourished. No distress.  HENT:  Head: Normocephalic and atraumatic.  Eyes: Pupils are equal, round, and reactive to light. EOM are normal. Right eye exhibits no discharge. Left eye exhibits no discharge. Left conjunctiva is injected.     Pulmonary/Chest: Effort normal.  Lymphadenopathy:    She has no cervical adenopathy.  Neurological: She is alert and oriented to person, place, and time.  Skin: Skin is warm and dry. She is not diaphoretic.  Psychiatric: She has a normal mood and affect. Her behavior is normal.  Nursing note and vitals reviewed.    ED Treatments / Results  Labs (all labs ordered are listed, but only abnormal results are displayed) Labs Reviewed - No data to display  EKG None  Radiology No results found.  Procedures Procedures (including critical care time)  Medications Ordered in ED Medications - No data to display   Initial Impression / Assessment and Plan / ED Course  I have reviewed the triage vital signs and the nursing notes.  Pertinent labs & imaging results that were available during my care of the patient were reviewed by me and considered in my medical decision making (see chart for details).  Clinical Course as of Dec 07 1419  Thu Dec 07, 6166  924 52 year old female with complaint of left eye discomfort.  On exam she has upper and lower styes.  Recommend she continue with warm compresses, given prescription for erythromycin ointment and referred to ophthalmology for follow-up.  Patient does not wear glasses or contacts, no visual disturbance.  Return to ER for worsening or concerning symptoms.   [LM]    Clinical Course User Index [LM] Tacy Learn, PA-C   Final Clinical Impressions(s) / ED Diagnoses   Final diagnoses:  Hordeolum externum of left eye, unspecified eyelid    ED Discharge Orders         Ordered    erythromycin ophthalmic ointment     12/06/17 1335           Tacy Learn, PA-C 12/06/17 1421    Julianne Rice, MD 12/07/17 1515
